# Patient Record
Sex: Male | Born: 1960 | Race: Black or African American | Hispanic: No | Marital: Married | Smoking: Never smoker
Health system: Southern US, Community
[De-identification: ages and names within clinical notes are randomized; demographics above are authoritative.]

## PROBLEM LIST (undated history)

## (undated) DIAGNOSIS — E785 Hyperlipidemia, unspecified: Secondary | ICD-10-CM

## (undated) DIAGNOSIS — J302 Other seasonal allergic rhinitis: Secondary | ICD-10-CM

## (undated) DIAGNOSIS — Z723 Lack of physical exercise: Secondary | ICD-10-CM

## (undated) DIAGNOSIS — E663 Overweight: Secondary | ICD-10-CM

## (undated) DIAGNOSIS — H5461 Unqualified visual loss, right eye, normal vision left eye: Secondary | ICD-10-CM

## (undated) DIAGNOSIS — Z800 Family history of malignant neoplasm of digestive organs: Secondary | ICD-10-CM

## (undated) DIAGNOSIS — K635 Polyp of colon: Secondary | ICD-10-CM

## (undated) DIAGNOSIS — E119 Type 2 diabetes mellitus without complications: Secondary | ICD-10-CM

## (undated) DIAGNOSIS — C61 Malignant neoplasm of prostate: Principal | ICD-10-CM

## (undated) DIAGNOSIS — N5089 Other specified disorders of the male genital organs: Secondary | ICD-10-CM

## (undated) DIAGNOSIS — G4730 Sleep apnea, unspecified: Secondary | ICD-10-CM

## (undated) DIAGNOSIS — K439 Ventral hernia without obstruction or gangrene: Secondary | ICD-10-CM

## (undated) HISTORY — DX: Unqualified visual loss, right eye, normal vision left eye: H54.61

## (undated) HISTORY — DX: Other specified disorders of the male genital organs: N50.89

## (undated) HISTORY — PX: EYE SURGERY: SHX253

## (undated) HISTORY — DX: Other seasonal allergic rhinitis: J30.2

## (undated) HISTORY — DX: Ventral hernia without obstruction or gangrene: K43.9

## (undated) HISTORY — DX: Overweight: E66.3

## (undated) HISTORY — DX: Family history of malignant neoplasm of digestive organs: Z80.0

## (undated) HISTORY — DX: Lack of physical exercise: Z72.3

## (undated) HISTORY — DX: Sleep apnea, unspecified: G47.30

## (undated) HISTORY — PX: TESTICULAR EXPLORATION: SHX5145

## (undated) HISTORY — DX: Polyp of colon: K63.5

## (undated) HISTORY — PX: COLONOSCOPY: SHX174

---

## 2005-05-10 ENCOUNTER — Emergency Department: Payer: Self-pay | Admitting: Unknown Physician Specialty

## 2005-05-10 ENCOUNTER — Other Ambulatory Visit: Payer: Self-pay

## 2005-12-09 ENCOUNTER — Ambulatory Visit: Payer: Self-pay | Admitting: Gastroenterology

## 2005-12-09 HISTORY — PX: COLONOSCOPY W/ POLYPECTOMY: SHX1380

## 2006-11-29 ENCOUNTER — Ambulatory Visit: Payer: Self-pay | Admitting: General Practice

## 2007-01-03 ENCOUNTER — Ambulatory Visit: Payer: Self-pay | Admitting: General Practice

## 2007-04-25 IMAGING — CT CT CHEST W/ CM
2 series · 15 of 31 positions shown, 19 images · IV contrast (APPLIED)
Comparison: none

REASON FOR EXAM: Chest pain. Rm 15
COMMENTS:

[Series 5: soft tissue · axial · 0.74mm/px · z∈[+602,+644]mm · 2 of 91 slices shown]
[im 7/91  mediastinal]
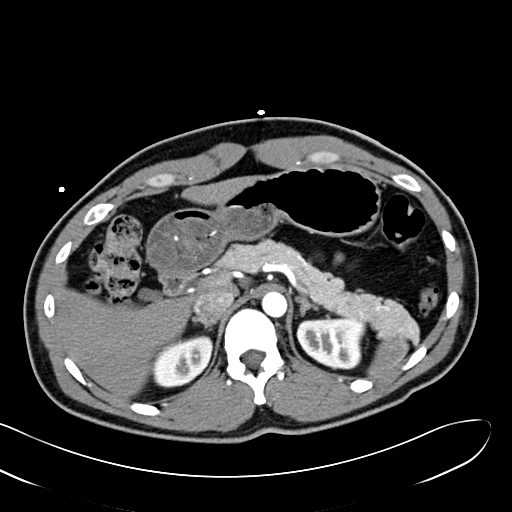
[im 21/91  mediastinal]
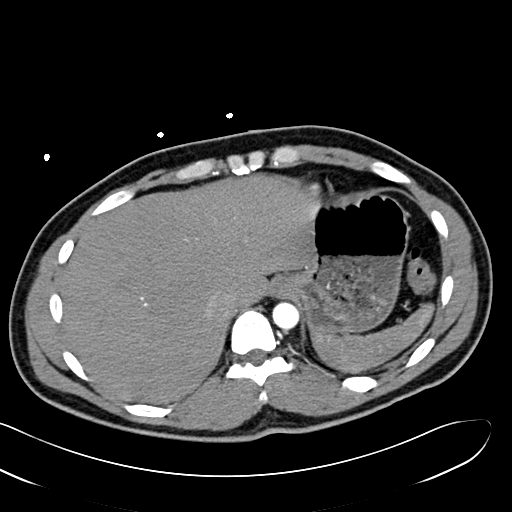

[Series 6: lung windows · axial · 0.74mm/px · z∈[+608,+834]mm · 13 of 89 slices shown, 17 images]
[im 7/89  mediastinal]
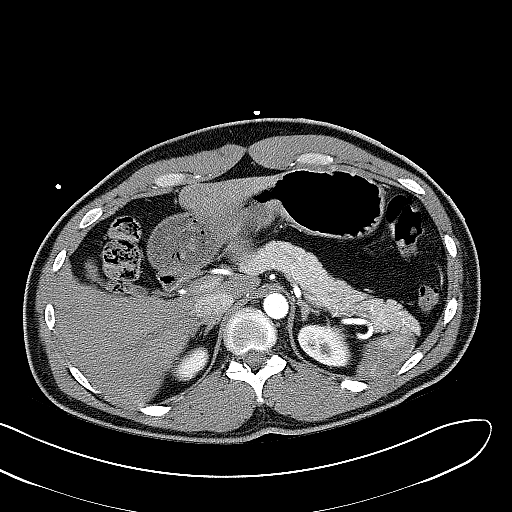
[im 7/89  lung]
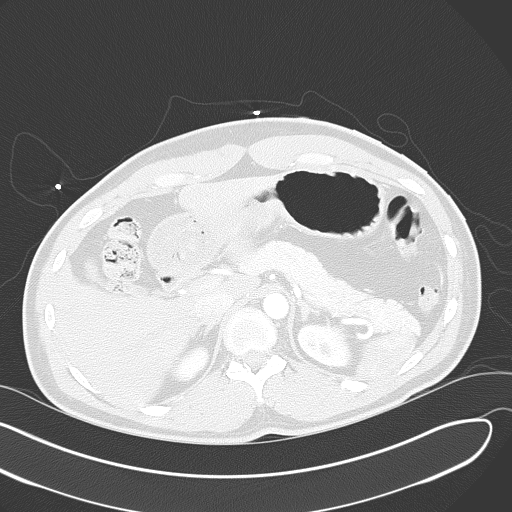
[im 14/89  lung]
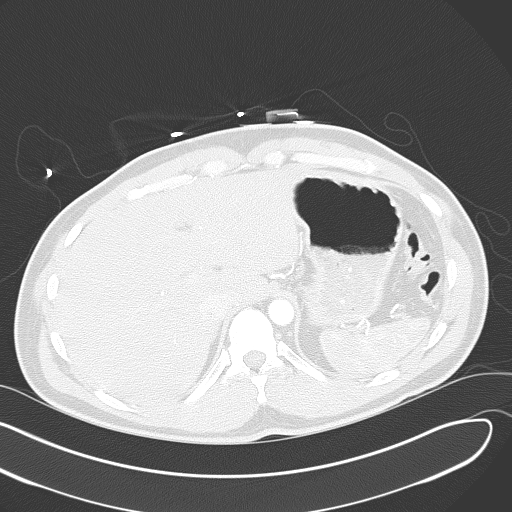
[im 21/89  lung]
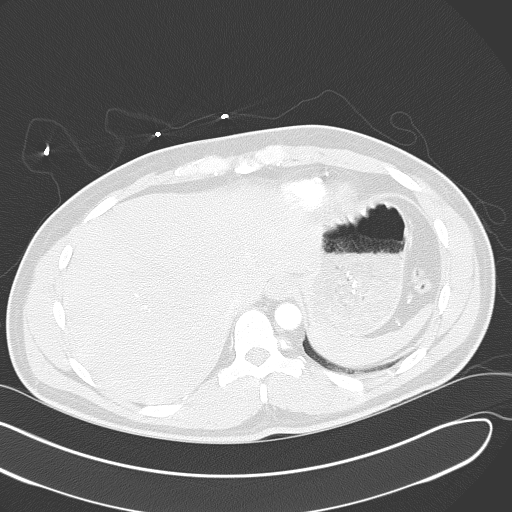
[im 28/89  lung]
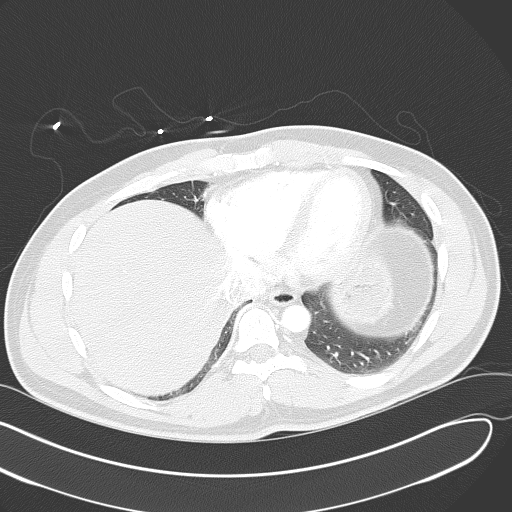
[im 34/89  mediastinal]
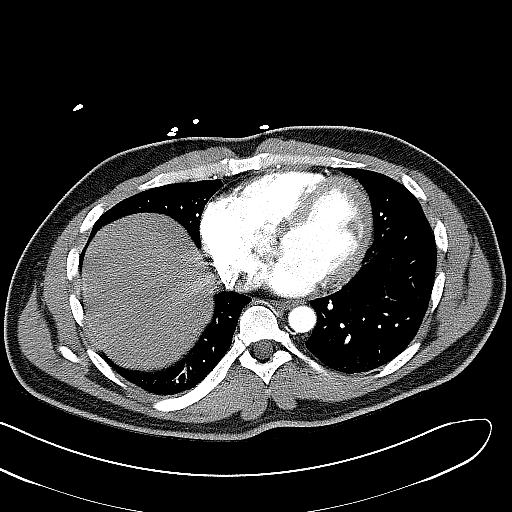
[im 34/89  lung]
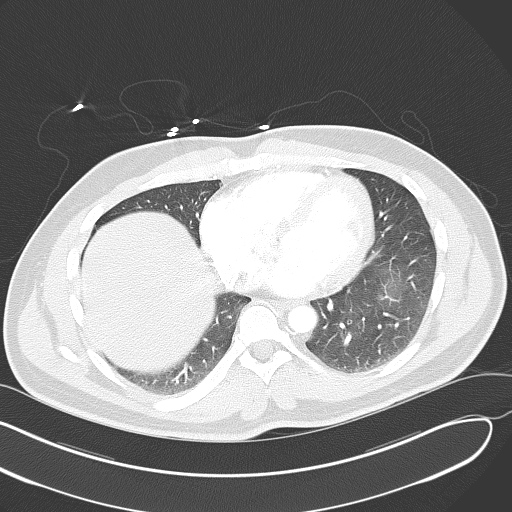
[im 41/89  lung]
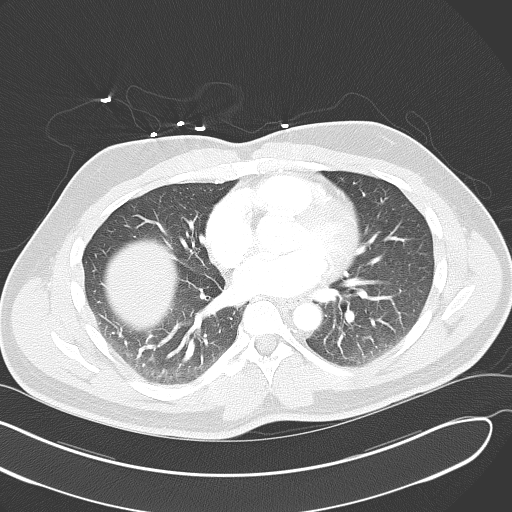
[im 45/89  lung]
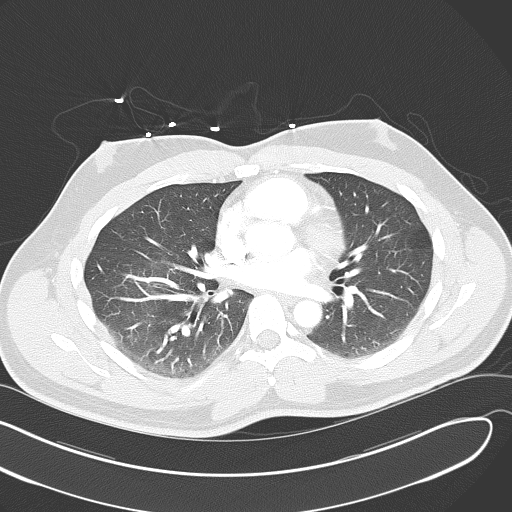
[im 48/89  lung]
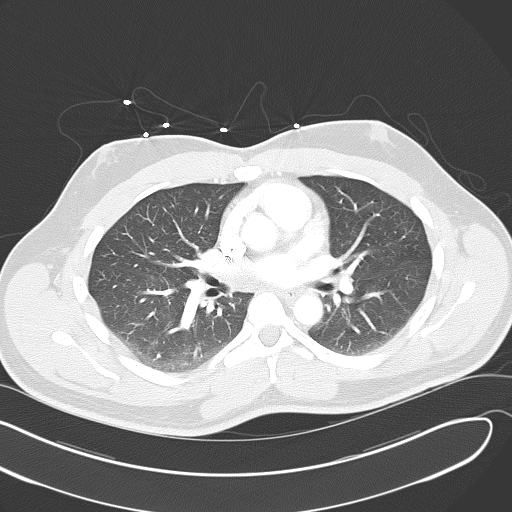
[im 55/89  mediastinal]
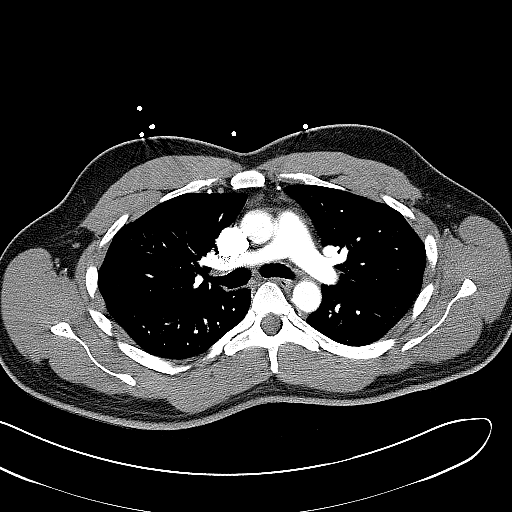
[im 55/89  lung]
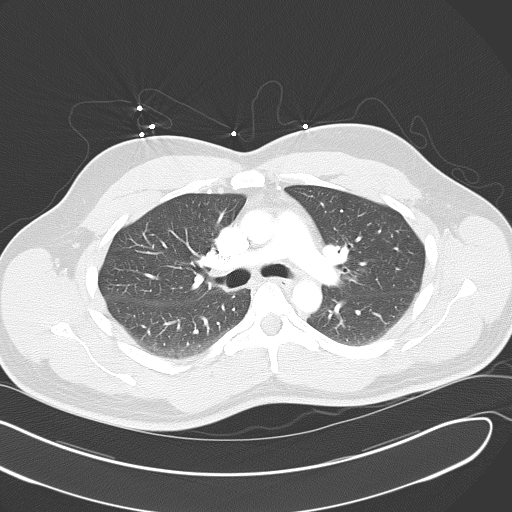
[im 61/89  lung]
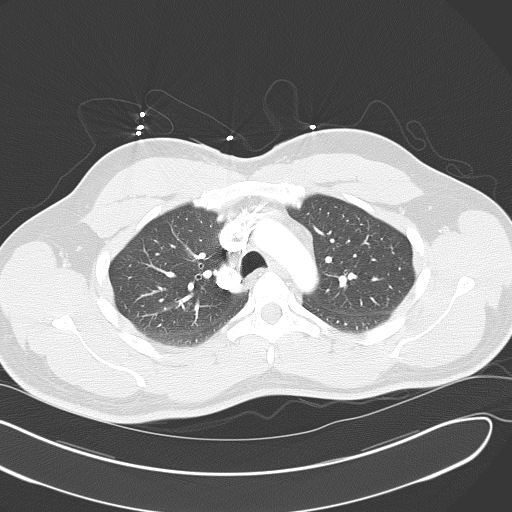
[im 68/89  lung]
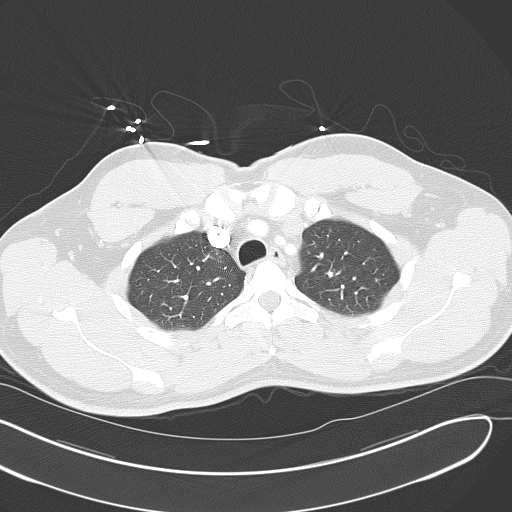
[im 75/89  lung]
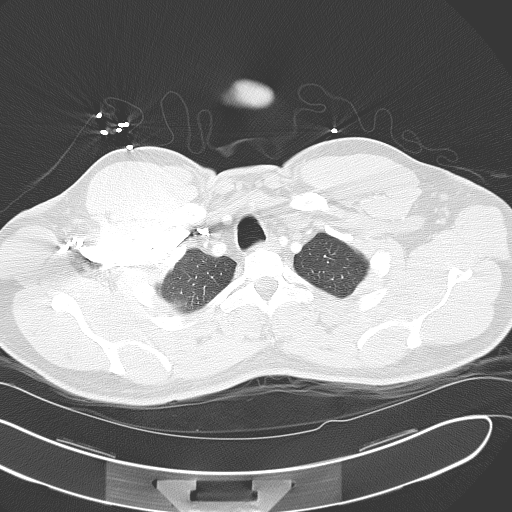
[im 82/89  mediastinal]
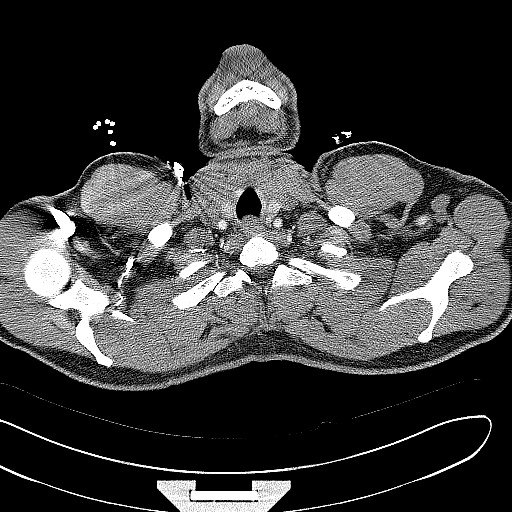
[im 82/89  lung]
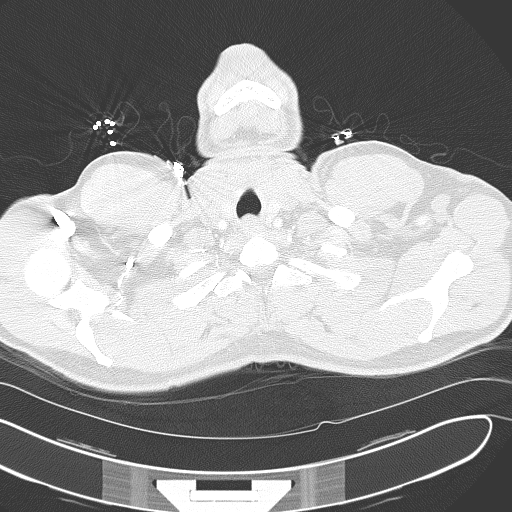

[15 of 31 positions shown; findings below may reference images not displayed]

PROCEDURE:     CT  - CT CHEST (FOR PE) W  - May 10, 2005  [DATE]

RESULT:     The study was tailored to evaluate the patient for possible
acute pulmonary embolism.

The contrast within the pulmonary arterial tree is normal in appearance.  I
do not see evidence of an acute pulmonary embolism.  The cardiac chambers
are not enlarged.  There is no evidence of a pleural or pericardial
effusion.  At lung window settings I see no interstitial or alveolar
infiltrate or abnormal nodules.  The caliber of the thoracic aorta is
normal.  Within the upper abdomen the observed portions of the liver exhibit
no acute abnormality.  The spleen is not enlarged.  I see no adrenal masses.
 The gallbladder appears contracted.
IMPRESSION: I see no acute pulmonary embolism or other acute abnormality of the lungs or
heart.

I see no acute abnormality elsewhere in the thorax.

A preliminary report was sent to the emergency room at the conclusion of the
study.

## 2008-05-24 ENCOUNTER — Ambulatory Visit: Payer: Self-pay | Admitting: Urology

## 2008-05-30 ENCOUNTER — Ambulatory Visit: Payer: Self-pay | Admitting: Urology

## 2010-11-28 ENCOUNTER — Ambulatory Visit: Payer: Self-pay | Admitting: Gastroenterology

## 2011-06-13 ENCOUNTER — Ambulatory Visit: Payer: Self-pay | Admitting: General Practice

## 2015-02-15 ENCOUNTER — Ambulatory Visit: Payer: Self-pay | Admitting: Urology

## 2015-02-17 ENCOUNTER — Ambulatory Visit: Payer: Self-pay | Admitting: Obstetrics and Gynecology

## 2015-02-17 ENCOUNTER — Encounter: Payer: Self-pay | Admitting: Obstetrics and Gynecology

## 2015-02-17 ENCOUNTER — Ambulatory Visit: Payer: Self-pay | Admitting: Urology

## 2018-07-14 ENCOUNTER — Other Ambulatory Visit: Payer: Self-pay

## 2018-07-15 LAB — CMP12+LP+TP+TSH+6AC+PSA+CBC…
ALT: 38 IU/L (ref 0–44)
Albumin/Globulin Ratio: 1.7 (ref 1.2–2.2)
Alkaline Phosphatase: 89 IU/L (ref 39–117)
BUN/Creatinine Ratio: 16 (ref 9–20)
Basophils Absolute: 0 10*3/uL (ref 0.0–0.2)
Basos: 0 %
Calcium: 10.1 mg/dL (ref 8.7–10.2)
Chol/HDL Ratio: 4.6 ratio (ref 0.0–5.0)
EOS (ABSOLUTE): 0.1 10*3/uL (ref 0.0–0.4)
Eos: 1 %
GFR calc non Af Amer: 83 mL/min/{1.73_m2} (ref >59)
GGT: 41 IU/L (ref 0–65)
Globulin, Total: 2.6 g/dL (ref 1.5–4.5)
Glucose: 279 mg/dL — ABNORMAL HIGH (ref 65–99)
HDL: 50 mg/dL (ref >39)
Hematocrit: 41.1 % (ref 37.5–51.0)
Hemoglobin: 14 g/dL (ref 13.0–17.7)
Immature Grans (Abs): 0 10*3/uL (ref 0.0–0.1)
Immature Granulocytes: 0 %
LDH: 117 IU/L — ABNORMAL LOW (ref 121–224)
LDL Calculated: 145 mg/dL — ABNORMAL HIGH (ref 0–99)
Lymphocytes Absolute: 2.9 10*3/uL (ref 0.7–3.1)
Lymphs: 41 %
Monocytes Absolute: 0.6 10*3/uL (ref 0.1–0.9)
Neutrophils: 49 %
Prostate Specific Ag, Serum: 1.9 ng/mL (ref 0.0–4.0)
RBC: 4.62 x10E6/uL (ref 4.14–5.80)
RDW: 11.9 % (ref 11.6–15.4)
Sodium: 135 mmol/L (ref 134–144)
Total Protein: 7.1 g/dL (ref 6.0–8.5)
Triglycerides: 180 mg/dL — ABNORMAL HIGH (ref 0–149)
Uric Acid: 5.3 mg/dL (ref 3.7–8.6)
WBC: 7 10*3/uL (ref 3.4–10.8)

## 2018-07-15 LAB — MICROALBUMIN / CREATININE URINE RATIO
Creatinine, Urine: 127.5 mg/dL
Microalb/Creat Ratio: 7 mg/g creat (ref 0–29)
Microalbumin, Urine: 9.1 ug/mL

## 2018-07-15 LAB — CMP12+LP+TP+TSH+6AC+PSA+CBC?
AST: 25 IU/L (ref 0–40)
Albumin: 4.5 g/dL (ref 3.8–4.9)
BUN: 16 mg/dL (ref 6–24)
Bilirubin Total: 0.8 mg/dL (ref 0.0–1.2)
Chloride: 99 mmol/L (ref 96–106)
Cholesterol, Total: 231 mg/dL — ABNORMAL HIGH (ref 100–199)
Creatinine, Ser: 1.01 mg/dL (ref 0.76–1.27)
Estimated CHD Risk: 0.9 times avg. (ref 0.0–1.0)
Free Thyroxine Index: 1.8 (ref 1.2–4.9)
GFR calc Af Amer: 96 mL/min/{1.73_m2} (ref >59)
Iron: 128 ug/dL (ref 38–169)
MCH: 30.3 pg (ref 26.6–33.0)
MCHC: 34.1 g/dL (ref 31.5–35.7)
MCV: 89 fL (ref 79–97)
Monocytes: 9 %
Neutrophils Absolute: 3.4 10*3/uL (ref 1.4–7.0)
Phosphorus: 3.6 mg/dL (ref 2.8–4.1)
Platelets: 254 10*3/uL (ref 150–450)
Potassium: 4.3 mmol/L (ref 3.5–5.2)
T3 Uptake Ratio: 26 % (ref 24–39)
T4, Total: 7 ug/dL (ref 4.5–12.0)
TSH: 1.73 u[IU]/mL (ref 0.450–4.500)
VLDL Cholesterol Cal: 36 mg/dL (ref 5–40)

## 2018-07-15 LAB — HGB A1C W/O EAG: Hgb A1c MFr Bld: 10.1 % — ABNORMAL HIGH (ref 4.8–5.6)

## 2018-09-18 ENCOUNTER — Other Ambulatory Visit: Admission: RE | Admit: 2018-09-18 | Payer: Self-pay | Source: Ambulatory Visit

## 2018-09-18 ENCOUNTER — Other Ambulatory Visit
Admission: RE | Admit: 2018-09-18 | Discharge: 2018-09-18 | Disposition: A | Discharge status: Home / Self Care | Payer: 59 | Source: Ambulatory Visit | Attending: Internal Medicine | Admitting: Internal Medicine

## 2018-09-18 ENCOUNTER — Other Ambulatory Visit: Payer: Self-pay

## 2018-09-18 DIAGNOSIS — Z20828 Contact with and (suspected) exposure to other viral communicable diseases: Secondary | ICD-10-CM | POA: Insufficient documentation

## 2018-09-18 DIAGNOSIS — Z01812 Encounter for preprocedural laboratory examination: Secondary | ICD-10-CM | POA: Insufficient documentation

## 2018-09-19 LAB — SARS CORONAVIRUS 2 (TAT 6-24 HRS): SARS Coronavirus 2: NEGATIVE

## 2018-09-23 ENCOUNTER — Encounter
Admission: RE | Disposition: A | Discharge status: Home / Self Care | Payer: Self-pay | Source: Home / Self Care | Attending: Internal Medicine

## 2018-09-23 ENCOUNTER — Ambulatory Visit: Payer: 59 | Admitting: Anesthesiology

## 2018-09-23 ENCOUNTER — Other Ambulatory Visit: Payer: Self-pay

## 2018-09-23 ENCOUNTER — Encounter: Payer: Self-pay | Admitting: Emergency Medicine

## 2018-09-23 ENCOUNTER — Ambulatory Visit
Admission: RE | Admit: 2018-09-23 | Discharge: 2018-09-23 | Disposition: A | Discharge status: Home / Self Care | Payer: 59 | Attending: Internal Medicine | Admitting: Internal Medicine

## 2018-09-23 DIAGNOSIS — Z7984 Long term (current) use of oral hypoglycemic drugs: Secondary | ICD-10-CM | POA: Insufficient documentation

## 2018-09-23 DIAGNOSIS — Z7982 Long term (current) use of aspirin: Secondary | ICD-10-CM | POA: Diagnosis not present

## 2018-09-23 DIAGNOSIS — E119 Type 2 diabetes mellitus without complications: Secondary | ICD-10-CM | POA: Diagnosis not present

## 2018-09-23 DIAGNOSIS — K640 First degree hemorrhoids: Secondary | ICD-10-CM | POA: Diagnosis not present

## 2018-09-23 DIAGNOSIS — K5730 Diverticulosis of large intestine without perforation or abscess without bleeding: Secondary | ICD-10-CM | POA: Insufficient documentation

## 2018-09-23 DIAGNOSIS — Z800 Family history of malignant neoplasm of digestive organs: Secondary | ICD-10-CM | POA: Diagnosis not present

## 2018-09-23 DIAGNOSIS — Z86010 Personal history of colonic polyps: Secondary | ICD-10-CM | POA: Insufficient documentation

## 2018-09-23 DIAGNOSIS — Z79899 Other long term (current) drug therapy: Secondary | ICD-10-CM | POA: Insufficient documentation

## 2018-09-23 DIAGNOSIS — Z1211 Encounter for screening for malignant neoplasm of colon: Secondary | ICD-10-CM | POA: Diagnosis not present

## 2018-09-23 DIAGNOSIS — E785 Hyperlipidemia, unspecified: Secondary | ICD-10-CM | POA: Diagnosis not present

## 2018-09-23 HISTORY — DX: Hyperlipidemia, unspecified: E78.5

## 2018-09-23 HISTORY — DX: Type 2 diabetes mellitus without complications: E11.9

## 2018-09-23 HISTORY — PX: COLONOSCOPY WITH PROPOFOL: SHX5780

## 2018-09-23 LAB — GLUCOSE, CAPILLARY: Glucose-Capillary: 109 mg/dL — ABNORMAL HIGH (ref 70–99)

## 2018-09-23 SURGERY — COLONOSCOPY WITH PROPOFOL
Anesthesia: General

## 2018-09-23 MED ORDER — PROPOFOL 500 MG/50ML IV EMUL
INTRAVENOUS | Status: AC
  Filled 2018-09-23: qty 400

## 2018-09-23 MED ORDER — PROPOFOL 500 MG/50ML IV EMUL
INTRAVENOUS | Status: AC
  Filled 2018-09-23: qty 100

## 2018-09-23 MED ORDER — PROPOFOL 10 MG/ML IV BOLUS
INTRAVENOUS | Status: DC | PRN
  Administered 2018-09-23 (×6): 50 mg via INTRAVENOUS

## 2018-09-23 MED ORDER — PHENYLEPHRINE HCL (PRESSORS) 10 MG/ML IV SOLN
INTRAVENOUS | Status: AC
  Filled 2018-09-23: qty 1

## 2018-09-23 MED ORDER — EPHEDRINE SULFATE 50 MG/ML IJ SOLN
INTRAMUSCULAR | Status: AC
  Filled 2018-09-23: qty 1

## 2018-09-23 MED ORDER — GLYCOPYRROLATE 0.2 MG/ML IJ SOLN
INTRAMUSCULAR | Status: AC
  Filled 2018-09-23: qty 5

## 2018-09-23 MED ORDER — GLYCOPYRROLATE 0.2 MG/ML IJ SOLN
INTRAMUSCULAR | Status: AC
  Filled 2018-09-23: qty 2

## 2018-09-23 MED ORDER — LIDOCAINE HCL (PF) 2 % IJ SOLN
INTRAMUSCULAR | Status: AC
  Filled 2018-09-23: qty 20

## 2018-09-23 MED ORDER — LIDOCAINE HCL (PF) 2 % IJ SOLN
INTRAMUSCULAR | Status: AC
  Filled 2018-09-23: qty 50

## 2018-09-23 MED ORDER — SODIUM CHLORIDE 0.9 % IV SOLN
INTRAVENOUS | Status: DC
  Administered 2018-09-23: 08:00:00 1000 mL via INTRAVENOUS

## 2018-09-23 MED ORDER — GLYCOPYRROLATE 0.2 MG/ML IJ SOLN
INTRAMUSCULAR | Status: AC
  Filled 2018-09-23: qty 1

## 2018-09-23 MED ORDER — PHENYLEPHRINE HCL (PRESSORS) 10 MG/ML IV SOLN
INTRAVENOUS | Status: DC | PRN
  Administered 2018-09-23: 100 ug via INTRAVENOUS

## 2018-09-23 MED ORDER — PROPOFOL 10 MG/ML IV BOLUS
INTRAVENOUS | Status: AC
  Filled 2018-09-23: qty 60

## 2018-09-23 NOTE — Transfer of Care (Signed)
Immediate Anesthesia Transfer of Care Note  Patient: HAZIEL MOLNER  Procedure(s) Performed: COLONOSCOPY WITH PROPOFOL (N/A )  Patient Location: Endoscopy Unit  Anesthesia Type:General  Level of Consciousness: drowsy and responds to stimulation  Airway & Oxygen Therapy: Patient Spontanous Breathing and Patient connected to face mask oxygen  Post-op Assessment: Report given to RN and Post -op Vital signs reviewed and stable  Post vital signs: Reviewed and stable  Last Vitals:  Vitals Value Taken Time  BP    Temp    Pulse 85 09/23/18 0835  Resp 10 09/23/18 0835  SpO2 100 % 09/23/18 0835  Vitals shown include unvalidated device data.  Last Pain:  Vitals:   09/23/18 0731  TempSrc: Tympanic  PainSc: 0-No pain         Complications: No apparent anesthesia complications

## 2018-09-23 NOTE — H&P (Signed)
Outpatient short stay form Pre-procedure 09/23/2018 8:18 AM Teodoro K. Alice Reichert, M.D.  Primary Physician: Lebron Conners, M.D.  Reason for visit: Personal hx of colon polyps and FH colon cancer  History of present illness:  As above. Patient denies change in bowel habits, rectal bleeding, weight loss or abdominal pain.     Current Facility-Administered Medications:  .  0.9 %  sodium chloride infusion, , Intravenous, Continuous, Route 7 [place], Benay Pike, MD, Last Rate: 20 mL/hr at 09/23/18 0746, 1,000 mL at 09/23/18 0746  Medications Prior to Admission  Medication Sig Dispense Refill Last Dose  . aspirin 81 MG chewable tablet Chew 81 mg by mouth daily.   Past Week at Unknown time  . atorvastatin (LIPITOR) 10 MG tablet Take 10 mg by mouth daily.   Past Week at Unknown time  . metFORMIN (GLUCOPHAGE) 1000 MG tablet Take 1,000 mg by mouth 2 (two) times daily with a meal.   Past Week at Unknown time     Not on File   Past Medical History:  Diagnosis Date  . Diabetes mellitus without complication ([place])    type 2  . Hyperlipidemia     Review of systems:  Otherwise negative.    Physical Exam  Gen: Alert, oriented. Appears stated age.  HEENT: [place]/AT. PERRLA. Lungs: CTA, no wheezes. CV: RR nl S1, S2. Abd: soft, benign, no masses. BS+ Ext: No edema. Pulses 2+    Planned procedures: Proceed with colonoscopy. The patient understands the nature of the planned procedure, indications, risks, alternatives and potential complications including but not limited to bleeding, infection, perforation, damage to internal organs and possible oversedation/side effects from anesthesia. The patient agrees and gives consent to proceed.  Please refer to procedure notes for findings, recommendations and patient disposition/instructions.     Teodoro K. Alice Reichert, M.D. Gastroenterology 09/23/2018  8:18 AM

## 2018-09-23 NOTE — Op Note (Signed)
[facility] Gastroenterology Patient Name: Trevione Wert Procedure Date: 09/23/2018 7:26 AM MRN: [id number] Account #: [id number] Date of Birth: 02-22-1960 Admit Type: Outpatient Age: 58 Room: [facility] ENDO ROOM 3 Gender: Male Note Status: Finalized Procedure:            Colonoscopy Indications:          High risk colon cancer surveillance: Personal history                        of colonic polyps Providers:            Benay Pike. Kanai Berrios MD, MD Medicines:            Propofol per Anesthesia Complications:        No immediate complications. Procedure:            Pre-Anesthesia Assessment:                       - The risks and benefits of the procedure and the                        sedation options and risks were discussed with the                        patient. All questions were answered and informed                        consent was obtained.                       - Patient identification and proposed procedure were                        verified prior to the procedure by the nurse. The                        procedure was verified in the procedure room.                       - ASA Grade Assessment: III - A patient with severe                        systemic disease.                       - After reviewing the risks and benefits, the patient                        was deemed in satisfactory condition to undergo the                        procedure.                       After obtaining informed consent, the colonoscope was                        passed under direct vision. Throughout the procedure,                        the patient's blood pressure, pulse, and oxygen  saturations were monitored continuously. The                        Colonoscope was introduced through the anus and                        advanced to the the cecum, identified by appendiceal                        orifice and ileocecal valve. The colonoscopy was                performed without difficulty. The patient tolerated the                        procedure well. The quality of the bowel preparation                        was good. The ileocecal valve, appendiceal orifice, and                        rectum were photographed. Findings:      The perianal and digital rectal examinations were normal. Pertinent       negatives include normal sphincter tone and no palpable rectal lesions.      A few small-mouthed diverticula were found in the sigmoid colon.      Non-bleeding internal hemorrhoids were found during retroflexion. The       hemorrhoids were Grade I (internal hemorrhoids that do not prolapse). Impression:           - Diverticulosis in the sigmoid colon.                       - Non-bleeding internal hemorrhoids.                       - No specimens collected. Recommendation:       - Repeat colonoscopy in 5 years for surveillance.                       - Return to GI office PRN. Procedure Code(s):    --- Professional ---                       [id number], Colorectal cancer screening; colonoscopy on                        individual at high risk Diagnosis Code(s):    --- Professional ---                       K57.30, Diverticulosis of large intestine without                        perforation or abscess without bleeding                       K64.0, First degree hemorrhoids                       Z86.010, Personal history of colonic polyps CPT copyright 2019 American Medical Association. All rights reserved. The codes documented in this report are preliminary and upon coder review may  be revised to  meet current compliance requirements. Stanton Kidneyeodoro K Deandrew Hoecker MD, MD 09/23/2018 8:36:59 AM This report has been signed electronically. Number of Addenda: 0 Note Initiated On: 09/23/2018 7:26 AM Scope Withdrawal Time: 0 hours 5 minutes 32 seconds  Total Procedure Duration: 0 hours 9 minutes 10 seconds  Estimated Blood Loss: Estimated blood loss: none.       [facility]

## 2018-09-23 NOTE — Anesthesia Postprocedure Evaluation (Signed)
Anesthesia Post Note  Patient: Jack Marshall  Procedure(s) Performed: COLONOSCOPY WITH PROPOFOL (N/A )  Patient location during evaluation: Endoscopy Anesthesia Type: General Level of consciousness: awake and alert and oriented Pain management: pain level controlled Vital Signs Assessment: post-procedure vital signs reviewed and stable Respiratory status: spontaneous breathing, nonlabored ventilation and respiratory function stable Cardiovascular status: blood pressure returned to baseline and stable Postop Assessment: no signs of nausea or vomiting Anesthetic complications: no     Last Vitals:  Vitals:   09/23/18 0858 09/23/18 0908  BP: 119/87 117/81  Pulse: 80   Resp: 20 13  Temp:    SpO2: 99% 99%    Last Pain:  Vitals:   09/23/18 0908  TempSrc:   PainSc: 0-No pain                 Yakov Bergen

## 2018-09-23 NOTE — Anesthesia Preprocedure Evaluation (Signed)
Anesthesia Evaluation  Patient identified by MRN, date of birth, ID band Patient awake    Reviewed: Allergy & Precautions, NPO status , Patient's Chart, lab work & pertinent test results  History of Anesthesia Complications Negative for: history of anesthetic complications  Airway Mallampati: II  TM Distance: >3 FB Neck ROM: Full    Dental no notable dental hx.    Pulmonary neg pulmonary ROS, neg sleep apnea, neg COPD,    breath sounds clear to auscultation- rhonchi (-) wheezing      Cardiovascular Exercise Tolerance: Good (-) hypertension(-) CAD, (-) Past MI, (-) Cardiac Stents and (-) CABG  Rhythm:Regular Rate:Normal - Systolic murmurs and - Diastolic murmurs    Neuro/Psych neg Seizures negative neurological ROS  negative psych ROS   GI/Hepatic negative GI ROS, Neg liver ROS,   Endo/Other  diabetes, Oral Hypoglycemic Agents  Renal/GU negative Renal ROS     Musculoskeletal negative musculoskeletal ROS (+)   Abdominal (+) - obese,   Peds  Hematology negative hematology ROS (+)   Anesthesia Other Findings Past Medical History: No date: Diabetes mellitus without complication (HCC)     Comment:  type 2 No date: Hyperlipidemia   Reproductive/Obstetrics                             Anesthesia Physical Anesthesia Plan  ASA: II  Anesthesia Plan: General   Post-op Pain Management:    Induction: Intravenous  PONV Risk Score and Plan: 1 and Propofol infusion  Airway Management Planned: Natural Airway  Additional Equipment:   Intra-op Plan:   Post-operative Plan:   Informed Consent: I have reviewed the patients History and Physical, chart, labs and discussed the procedure including the risks, benefits and alternatives for the proposed anesthesia with the patient or authorized representative who has indicated his/her understanding and acceptance.     Dental advisory given  Plan  Discussed with: CRNA and Anesthesiologist  Anesthesia Plan Comments:         Anesthesia Quick Evaluation

## 2018-09-23 NOTE — Interval H&P Note (Signed)
History and Physical Interval Note:  09/23/2018 8:18 AM  Bobbe Medico  has presented today for surgery, with the diagnosis of PERSONAL HX.OF COLON POLYPS,FAMILY HX.OF COLON CANCER.  The various methods of treatment have been discussed with the patient and family. After consideration of risks, benefits and other options for treatment, the patient has consented to  Procedure(s): COLONOSCOPY WITH PROPOFOL (N/A) as a surgical intervention.  The patient's history has been reviewed, patient examined, no change in status, stable for surgery.  I have reviewed the patient's chart and labs.  Questions were answered to the patient's satisfaction.     [place], Hermann

## 2018-09-23 NOTE — Anesthesia Post-op Follow-up Note (Signed)
Anesthesia QCDR form completed.        

## 2018-09-24 ENCOUNTER — Encounter: Payer: Self-pay | Admitting: Internal Medicine

## 2018-10-01 ENCOUNTER — Telehealth: Payer: Self-pay | Admitting: Internal Medicine

## 2018-10-01 NOTE — Telephone Encounter (Signed)
Do not see any communication in Epic not sure who pt is calling back.

## 2018-10-01 NOTE — Telephone Encounter (Signed)
Returning call.

## 2018-10-20 ENCOUNTER — Ambulatory Visit: Payer: 59 | Admitting: Internal Medicine

## 2018-10-20 ENCOUNTER — Encounter: Payer: Self-pay | Admitting: Internal Medicine

## 2018-10-20 ENCOUNTER — Other Ambulatory Visit: Payer: Self-pay

## 2018-10-20 VITALS — BP 128/91 | HR 91 | Temp 96.4°F | Resp 14 | Ht 71.0 in | Wt 166.0 lb

## 2018-10-20 DIAGNOSIS — R030 Elevated blood-pressure reading, without diagnosis of hypertension: Secondary | ICD-10-CM | POA: Insufficient documentation

## 2018-10-20 DIAGNOSIS — E119 Type 2 diabetes mellitus without complications: Secondary | ICD-10-CM

## 2018-10-20 DIAGNOSIS — E7849 Other hyperlipidemia: Secondary | ICD-10-CM

## 2018-10-20 DIAGNOSIS — Z800 Family history of malignant neoplasm of digestive organs: Secondary | ICD-10-CM

## 2018-10-20 DIAGNOSIS — N529 Male erectile dysfunction, unspecified: Secondary | ICD-10-CM | POA: Insufficient documentation

## 2018-10-20 NOTE — Progress Notes (Signed)
S - Patient with a h/o NIDDM who follows-up for the every 3 month check-up. He did not have labs drawn prior to today's visit to help f/u.   Has been feeling well with no complaints, the urinary frequency is a little better since last visit.  Taking the medicines for his DM regularly  Not check blood sugars at home No CP, SOB, marked fatigue, numbness or tingling in the extremities, no LE swelling  Had colonoscopy 09/23/2018 and told all was good, no polyps of concern.  Did not follow-up with nutrition since last visit  Has not seen an eye MD either since last visit  Trying to eat a healthy diet and manage weight, avoiding goodies Exercise - still tries to remain active, noted marches in place a times last visit   No tob use Alcohol use - noted very rare, twice a year maybe  Current Outpatient Medications on File Prior to Visit  Medication Sig Dispense Refill  . aspirin 81 MG chewable tablet Chew 81 mg by mouth daily.    Marland Kitchen atorvastatin (LIPITOR) 10 MG tablet Take 10 mg by mouth daily.    . metFORMIN (GLUCOPHAGE) 1000 MG tablet Take 1,000 mg by mouth 2 (two) times daily with a meal.    . Multiple Vitamin (MULTIVITAMIN) capsule Take 1 capsule by mouth daily.     No current facility-administered medications on file prior to visit.      No Known Allergies   O - NAD  BP (!) 128/91 (BP Location: Right Arm, Patient Position: Sitting, Cuff Size: Large)   Pulse 91   Temp (!) 96.4 F (35.8 C) (Oral)   Resp 14   Ht 5\' 11"  (1.803 m)   Wt 166 lb (75.3 kg)   SpO2 98%   BMI 23.15 kg/m     BP last visit - 128/82 in June Weight 176 last visit (down 10 lbs)  HEENT - sclera anicteric Neck - carotids 2+ and = with no bruits,  Car - RRR without m/g/r Pulm - CTA Abd - NT Ext - no LE edema Neuro - Affect not flat, approp with conversation  Grossly nonfocal  Last labs reviewed including A1C - 10.1 in June (was 6.5 and 7.6 on prior checks in March 2020 and Dec 2019), LDL - 145  No  labs to review today  A/P - 1. NIDDM - not well controlled in recent past   Continue current medication (metformin) Importance of diet modifications emphasized Importance of some regular aerobic exercise encouraged Above to help with weight control/weight loss also important and has been doing great with this Check A1C today and also BMP, and rec'ed gets signed for MyChart on way out today to let him know results Again rec'ed f/u with eye MD for eval    2.  Hyperlipidemia   On a statin to continue Importance of diet modifications and some regular aerobic exercise encouraged Above to help with weight control/weight loss also very important  Will recheck FLP in another 3 months (offered to check today and he noted ok to wait as originally planned)  3. FH colon CA - did get scope done and all ok  4. Increased BP without dx of HTN - slightly higher reading today than prior readings in recent past  Cont to monitor at present  5. ED hx - not taking meds in recent past  F/u again in 3 months, sooner prn, and will check FLP, A1C and urine microalbumin prior to f/u visit, F/u  sooner prn

## 2018-10-20 NOTE — Addendum Note (Signed)
Addended by: Virgilio Frees on: 10/20/2018 04:06 PM   Modules accepted: Orders

## 2018-10-21 LAB — BASIC METABOLIC PANEL
BUN/Creatinine Ratio: 10 (ref 9–20)
BUN: 12 mg/dL (ref 6–24)
CO2: 25 mmol/L (ref 20–29)
Calcium: 10.3 mg/dL — ABNORMAL HIGH (ref 8.7–10.2)
Chloride: 98 mmol/L (ref 96–106)
Creatinine, Ser: 1.19 mg/dL (ref 0.76–1.27)
GFR calc Af Amer: 78 mL/min/{1.73_m2} (ref >59)
GFR calc non Af Amer: 67 mL/min/{1.73_m2} (ref >59)
Glucose: 110 mg/dL — ABNORMAL HIGH (ref 65–99)
Potassium: 4.6 mmol/L (ref 3.5–5.2)
Sodium: 140 mmol/L (ref 134–144)

## 2018-10-21 LAB — HGB A1C W/O EAG: Hgb A1c MFr Bld: 5.8 % — ABNORMAL HIGH (ref 4.8–5.6)

## 2018-11-10 ENCOUNTER — Ambulatory Visit: Payer: Self-pay

## 2018-11-10 DIAGNOSIS — Z23 Encounter for immunization: Secondary | ICD-10-CM

## 2018-12-25 ENCOUNTER — Other Ambulatory Visit: Payer: Self-pay

## 2018-12-25 DIAGNOSIS — Z20822 Contact with and (suspected) exposure to covid-19: Secondary | ICD-10-CM

## 2018-12-28 LAB — NOVEL CORONAVIRUS, NAA: SARS-CoV-2, NAA: NOT DETECTED

## 2019-01-12 ENCOUNTER — Other Ambulatory Visit: Payer: 59

## 2019-01-19 ENCOUNTER — Ambulatory Visit: Payer: 59

## 2019-06-24 ENCOUNTER — Other Ambulatory Visit: Payer: Self-pay

## 2019-06-24 ENCOUNTER — Ambulatory Visit: Payer: 59

## 2019-06-24 DIAGNOSIS — Z0000 Encounter for general adult medical examination without abnormal findings: Secondary | ICD-10-CM

## 2019-06-24 LAB — POCT URINALYSIS DIPSTICK
Bilirubin, UA: NEGATIVE
Blood, UA: NEGATIVE
Glucose, UA: NEGATIVE
Ketones, UA: NEGATIVE
Leukocytes, UA: NEGATIVE
Nitrite, UA: NEGATIVE
Protein, UA: NEGATIVE
Spec Grav, UA: >=1.03 — AB (ref 1.010–1.025)
Urobilinogen, UA: 0.2 E.U./dL
pH, UA: 6 (ref 5.0–8.0)

## 2019-06-24 NOTE — Progress Notes (Signed)
Scheduled to complete physical 06/30/19 with Ron Smith, PA-C.  AMD 

## 2019-06-25 LAB — CMP12+LP+TP+TSH+6AC+PSA+CBC…
ALT: 25 IU/L (ref 0–44)
AST: 25 IU/L (ref 0–40)
Albumin/Globulin Ratio: 1.6 (ref 1.2–2.2)
Albumin: 4.4 g/dL (ref 3.8–4.9)
Alkaline Phosphatase: 73 IU/L (ref 39–117)
BUN/Creatinine Ratio: 11 (ref 9–20)
BUN: 13 mg/dL (ref 6–24)
Basophils Absolute: 0 10*3/uL (ref 0.0–0.2)
Basos: 1 %
Bilirubin Total: 0.3 mg/dL (ref 0.0–1.2)
Calcium: 9.6 mg/dL (ref 8.7–10.2)
Chloride: 104 mmol/L (ref 96–106)
Chol/HDL Ratio: 2.7 ratio (ref 0.0–5.0)
Cholesterol, Total: 144 mg/dL (ref 100–199)
Creatinine, Ser: 1.19 mg/dL (ref 0.76–1.27)
EOS (ABSOLUTE): 0.2 10*3/uL (ref 0.0–0.4)
Eos: 2 %
Estimated CHD Risk: <0.5 times avg. (ref 0.0–1.0)
Free Thyroxine Index: 1.9 (ref 1.2–4.9)
GFR calc Af Amer: 78 mL/min/{1.73_m2} (ref >59)
GFR calc non Af Amer: 67 mL/min/{1.73_m2} (ref >59)
GGT: 29 IU/L (ref 0–65)
Globulin, Total: 2.7 g/dL (ref 1.5–4.5)
Glucose: 114 mg/dL — ABNORMAL HIGH (ref 65–99)
HDL: 53 mg/dL (ref >39)
Hematocrit: 41.5 % (ref 37.5–51.0)
Hemoglobin: 14.2 g/dL (ref 13.0–17.7)
Immature Grans (Abs): 0 10*3/uL (ref 0.0–0.1)
Immature Granulocytes: 0 %
Iron: 75 ug/dL (ref 38–169)
LDH: 154 IU/L (ref 121–224)
LDL Chol Calc (NIH): 73 mg/dL (ref 0–99)
Lymphocytes Absolute: 3.8 10*3/uL — ABNORMAL HIGH (ref 0.7–3.1)
Lymphs: 45 %
MCH: 30.4 pg (ref 26.6–33.0)
MCHC: 34.2 g/dL (ref 31.5–35.7)
MCV: 89 fL (ref 79–97)
Monocytes Absolute: 0.7 10*3/uL (ref 0.1–0.9)
Monocytes: 8 %
Neutrophils Absolute: 3.7 10*3/uL (ref 1.4–7.0)
Neutrophils: 44 %
Phosphorus: 3.9 mg/dL (ref 2.8–4.1)
Platelets: 265 10*3/uL (ref 150–450)
Potassium: 4.2 mmol/L (ref 3.5–5.2)
Prostate Specific Ag, Serum: 2.4 ng/mL (ref 0.0–4.0)
RBC: 4.67 x10E6/uL (ref 4.14–5.80)
RDW: 11.8 % (ref 11.6–15.4)
Sodium: 139 mmol/L (ref 134–144)
T3 Uptake Ratio: 27 % (ref 24–39)
T4, Total: 7 ug/dL (ref 4.5–12.0)
TSH: 2.29 u[IU]/mL (ref 0.450–4.500)
Total Protein: 7.1 g/dL (ref 6.0–8.5)
Triglycerides: 97 mg/dL (ref 0–149)
Uric Acid: 4.6 mg/dL (ref 3.8–8.4)
VLDL Cholesterol Cal: 18 mg/dL (ref 5–40)
WBC: 8.4 10*3/uL (ref 3.4–10.8)

## 2019-06-25 LAB — MICROALBUMIN / CREATININE URINE RATIO
Creatinine, Urine: 225.8 mg/dL
Microalb/Creat Ratio: 6 mg/g creat (ref 0–29)
Microalbumin, Urine: 12.5 ug/mL

## 2019-06-25 LAB — HGB A1C W/O EAG: Hgb A1c MFr Bld: 6.1 % — ABNORMAL HIGH (ref 4.8–5.6)

## 2019-06-30 ENCOUNTER — Ambulatory Visit: Payer: Self-pay | Admitting: Physician Assistant

## 2019-06-30 ENCOUNTER — Other Ambulatory Visit: Payer: Self-pay

## 2019-06-30 ENCOUNTER — Encounter: Payer: Self-pay | Admitting: Physician Assistant

## 2019-06-30 VITALS — BP 116/80 | HR 98 | Temp 98.7°F | Resp 12 | Ht 71.0 in | Wt 174.0 lb

## 2019-06-30 DIAGNOSIS — Z0000 Encounter for general adult medical examination without abnormal findings: Secondary | ICD-10-CM

## 2019-06-30 NOTE — Progress Notes (Signed)
   Subjective: Annual physical exam    Patient ID: Jack Marshall, male    DOB: 05/09/1960, 59 y.o.   MRN: [id number]  HPI Patient presents for annual physical exam voices no concerns or complaints.   Review of Systems    Diabetes and hyperlipidemia Objective:   Physical Exam No acute distress.  HEENT is grossly unremarkable.  Neck is supple without adenopathy or bruits.  Lungs are clear to auscultation.  Heart is slightly tachycardic regular in rhythm.  Abdomen negative HSM, normoactive bowel sounds, soft, and nontender to palpation.  No obvious deformity upper lower extremities.  Patient is full equal range of motion of the upper and lower extremities.  No obvious deformity to cervical lumbar spine.  Patient full neck range of motion cervical lumbar spine.  Cranial nerves II through XII grossly intact.       Assessment & Plan: Well exam  Discussed lab results with patient.  Advised to continue previous medications and follow-up as necessary.

## 2019-07-05 ENCOUNTER — Other Ambulatory Visit: Payer: Self-pay

## 2019-07-05 DIAGNOSIS — E7849 Other hyperlipidemia: Secondary | ICD-10-CM

## 2019-07-05 DIAGNOSIS — E119 Type 2 diabetes mellitus without complications: Secondary | ICD-10-CM

## 2019-07-06 MED ORDER — METFORMIN HCL 1000 MG PO TABS: 1000.0000 mg | ORAL_TABLET | Freq: Two times a day (BID) | ORAL | 3 refills | Status: DC

## 2019-07-06 MED ORDER — ATORVASTATIN CALCIUM 10 MG PO TABS: 10.0000 mg | ORAL_TABLET | Freq: Every day | ORAL | 3 refills | Status: DC

## 2019-08-10 DIAGNOSIS — H40013 Open angle with borderline findings, low risk, bilateral: Secondary | ICD-10-CM | POA: Diagnosis not present

## 2019-12-21 NOTE — Progress Notes (Deleted)
Scheduled to review lab results 12/29/19 with Kerrie Buffalo, NP.  AMD

## 2019-12-22 ENCOUNTER — Other Ambulatory Visit: Payer: 59

## 2019-12-22 DIAGNOSIS — E119 Type 2 diabetes mellitus without complications: Secondary | ICD-10-CM

## 2019-12-29 ENCOUNTER — Other Ambulatory Visit: Payer: Self-pay

## 2019-12-29 ENCOUNTER — Ambulatory Visit: Payer: 59 | Admitting: Nurse Practitioner

## 2019-12-29 DIAGNOSIS — E119 Type 2 diabetes mellitus without complications: Secondary | ICD-10-CM

## 2019-12-29 NOTE — Progress Notes (Signed)
Scheduled to review lab results 01/05/20 with Kerrie Buffalo, NP.  AMD

## 2019-12-30 LAB — BUN+CREAT
BUN/Creatinine Ratio: 10 (ref 9–20)
BUN: 11 mg/dL (ref 6–24)
Creatinine, Ser: 1.08 mg/dL (ref 0.76–1.27)
GFR calc Af Amer: 87 mL/min/{1.73_m2} (ref >59)
GFR calc non Af Amer: 75 mL/min/{1.73_m2} (ref >59)

## 2019-12-30 LAB — HGB A1C W/O EAG: Hgb A1c MFr Bld: 6 % — ABNORMAL HIGH (ref 4.8–5.6)

## 2020-01-05 ENCOUNTER — Other Ambulatory Visit: Payer: Self-pay

## 2020-01-05 ENCOUNTER — Ambulatory Visit: Payer: Self-pay | Admitting: Nurse Practitioner

## 2020-01-05 ENCOUNTER — Encounter: Payer: Self-pay | Admitting: Nurse Practitioner

## 2020-01-05 VITALS — BP 117/82 | HR 106 | Temp 97.7°F | Resp 14 | Ht 71.0 in | Wt 177.0 lb

## 2020-01-05 DIAGNOSIS — Z09 Encounter for follow-up examination after completed treatment for conditions other than malignant neoplasm: Secondary | ICD-10-CM

## 2020-01-05 NOTE — Progress Notes (Signed)
Presents for 6 months post physical DM Labs that were collected on 12/29/19.  Physical 06/30/19 with Durward Parcel, PA-C.  AMD

## 2020-01-05 NOTE — Progress Notes (Signed)
   Subjective:    Patient ID: Jack Marshall, male    DOB: 1961/01/14, 59 y.o.   MRN: [id number]  HPI Jack Marshall is a 59 y.o. male who presents to the [facility] to discuss results of lab work. He reports being on a regular exercise plan and healthy diet. He denies any problems today.  Past Medical History:  Diagnosis Date  . Colon polyp    2009, most recent in 2020 no polyps  . 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 Diabetes mellitus without complication (HCC)    type 2  . Family history of colon cancer   . Hyperlipidemia   . Lack of exercise   . Overweight   . Seasonal allergies   . Sleep apnea    does not wear cpap  . Testicular mass    right   . Ventral hernia    Past Surgical History:  Procedure Laterality Date  . COLONOSCOPY     normal colon  . COLONOSCOPY W/ POLYPECTOMY  12/09/2005   9 mm TA in ascending colon  . COLONOSCOPY WITH PROPOFOL N/A 09/23/2018   Procedure: COLONOSCOPY WITH PROPOFOL;  Surgeon: Toledo, Boykin Nearing, MD;  Location: [facility] ENDOSCOPY;  Service: Gastroenterology;  Laterality: N/A;  . EYE SURGERY    . TESTICULAR EXPLORATION     Current Outpatient Medications on File Prior to Visit  Medication Sig Dispense Refill  . aspirin 81 MG chewable tablet Chew 81 mg by mouth daily.    Marland Kitchen atorvastatin (LIPITOR) 10 MG tablet Take 1 tablet (10 mg total) by mouth daily. 90 tablet 3  . metFORMIN (GLUCOPHAGE) 1000 MG tablet Take 1 tablet (1,000 mg total) by mouth 2 (two) times daily with a meal. 180 tablet 3  . Multiple Vitamin (MULTIVITAMIN) capsule Take 1 capsule by mouth daily.     No current facility-administered medications on file prior to visit.   No Known Allergies BP 117/82 (BP Location: Left Arm, Patient Position: Sitting, Cuff Size: Large)   Pulse (!) 106   Temp 97.7 F (36.5 C) (Oral)   Resp 14   Ht 5\' 11"  (1.803 m)   Wt 177 lb (80.3 kg)   SpO2 98%   BMI 24.69 kg/m   Labs reviewed  Hgb A1c has improved and is 6.0     Assessment & Plan:  Patient without complaints today and doing well. Continue health diet and exercise. Continue medications. Return in 6 months for annual physical exam and blood work. Return sooner for any problems.

## 2020-06-27 NOTE — Progress Notes (Signed)
Pt scheduled to complete physical with Adam Scarboro, NP. CL,RMA  

## 2020-06-28 ENCOUNTER — Other Ambulatory Visit: Payer: Self-pay

## 2020-06-28 ENCOUNTER — Ambulatory Visit: Payer: Self-pay

## 2020-06-28 DIAGNOSIS — Z0000 Encounter for general adult medical examination without abnormal findings: Secondary | ICD-10-CM

## 2020-06-28 LAB — POCT URINALYSIS DIPSTICK
Bilirubin, UA: NEGATIVE
Blood, UA: NEGATIVE
Glucose, UA: NEGATIVE
Ketones, UA: NEGATIVE
Leukocytes, UA: NEGATIVE
Nitrite, UA: NEGATIVE
Protein, UA: NEGATIVE
Spec Grav, UA: 1.025 (ref 1.010–1.025)
Urobilinogen, UA: 0.2 E.U./dL
pH, UA: 5.5 (ref 5.0–8.0)

## 2020-06-29 LAB — CMP12+LP+TP+TSH+6AC+PSA+CBC…
ALT: 25 IU/L (ref 0–44)
AST: 36 IU/L (ref 0–40)
Albumin/Globulin Ratio: 2 (ref 1.2–2.2)
Albumin: 4.5 g/dL (ref 3.8–4.9)
Alkaline Phosphatase: 60 IU/L (ref 44–121)
BUN/Creatinine Ratio: 14 (ref 9–20)
BUN: 17 mg/dL (ref 6–24)
Basophils Absolute: 0 10*3/uL (ref 0.0–0.2)
Basos: 0 %
Bilirubin Total: 0.4 mg/dL (ref 0.0–1.2)
Calcium: 10.1 mg/dL (ref 8.7–10.2)
Chloride: 100 mmol/L (ref 96–106)
Chol/HDL Ratio: 2.2 ratio (ref 0.0–5.0)
Cholesterol, Total: 127 mg/dL (ref 100–199)
Creatinine, Ser: 1.2 mg/dL (ref 0.76–1.27)
EOS (ABSOLUTE): 0.2 10*3/uL (ref 0.0–0.4)
Eos: 2 %
Estimated CHD Risk: <0.5 times avg. (ref 0.0–1.0)
Free Thyroxine Index: 2.1 (ref 1.2–4.9)
GGT: 23 IU/L (ref 0–65)
Globulin, Total: 2.2 g/dL (ref 1.5–4.5)
Glucose: 100 mg/dL — ABNORMAL HIGH (ref 65–99)
HDL: 57 mg/dL (ref >39)
Hematocrit: 40 % (ref 37.5–51.0)
Hemoglobin: 13.4 g/dL (ref 13.0–17.7)
Immature Grans (Abs): 0 10*3/uL (ref 0.0–0.1)
Immature Granulocytes: 0 %
Iron: 50 ug/dL (ref 38–169)
LDH: 119 IU/L — ABNORMAL LOW (ref 121–224)
LDL Chol Calc (NIH): 57 mg/dL (ref 0–99)
Lymphocytes Absolute: 3.6 10*3/uL — ABNORMAL HIGH (ref 0.7–3.1)
Lymphs: 38 %
MCH: 31 pg (ref 26.6–33.0)
MCHC: 33.5 g/dL (ref 31.5–35.7)
MCV: 93 fL (ref 79–97)
Monocytes Absolute: 1 10*3/uL — ABNORMAL HIGH (ref 0.1–0.9)
Monocytes: 10 %
Neutrophils Absolute: 4.6 10*3/uL (ref 1.4–7.0)
Neutrophils: 50 %
Phosphorus: 4.1 mg/dL (ref 2.8–4.1)
Platelets: 262 10*3/uL (ref 150–450)
Potassium: 4.4 mmol/L (ref 3.5–5.2)
Prostate Specific Ag, Serum: 3.3 ng/mL (ref 0.0–4.0)
RBC: 4.32 x10E6/uL (ref 4.14–5.80)
RDW: 11.7 % (ref 11.6–15.4)
Sodium: 138 mmol/L (ref 134–144)
T3 Uptake Ratio: 27 % (ref 24–39)
T4, Total: 7.8 ug/dL (ref 4.5–12.0)
TSH: 2.5 u[IU]/mL (ref 0.450–4.500)
Total Protein: 6.7 g/dL (ref 6.0–8.5)
Triglycerides: 58 mg/dL (ref 0–149)
Uric Acid: 6.1 mg/dL (ref 3.8–8.4)
VLDL Cholesterol Cal: 13 mg/dL (ref 5–40)
WBC: 9.4 10*3/uL (ref 3.4–10.8)
eGFR: 70 mL/min/{1.73_m2} (ref >59)

## 2020-06-29 LAB — HGB A1C W/O EAG: Hgb A1c MFr Bld: 5.7 % — ABNORMAL HIGH (ref 4.8–5.6)

## 2020-06-29 LAB — MICROALBUMIN / CREATININE URINE RATIO
Creatinine, Urine: 172 mg/dL
Microalb/Creat Ratio: 3 mg/g creat (ref 0–29)
Microalbumin, Urine: 5.1 ug/mL

## 2020-07-05 ENCOUNTER — Encounter: Payer: Self-pay | Admitting: Adult Health

## 2020-07-05 ENCOUNTER — Other Ambulatory Visit: Payer: Self-pay

## 2020-07-05 ENCOUNTER — Ambulatory Visit: Payer: Self-pay | Admitting: Adult Health

## 2020-07-05 VITALS — BP 131/85 | HR 83 | Temp 97.6°F | Resp 14 | Ht 71.0 in | Wt 171.0 lb

## 2020-07-05 DIAGNOSIS — Z0000 Encounter for general adult medical examination without abnormal findings: Secondary | ICD-10-CM

## 2020-07-05 DIAGNOSIS — E119 Type 2 diabetes mellitus without complications: Secondary | ICD-10-CM

## 2020-07-05 DIAGNOSIS — G4733 Obstructive sleep apnea (adult) (pediatric): Secondary | ICD-10-CM

## 2020-07-05 DIAGNOSIS — E7849 Other hyperlipidemia: Secondary | ICD-10-CM

## 2020-07-05 NOTE — Progress Notes (Signed)
[facility] [place] [address] [place], [place] [address]  Internal MEDICINE  Office Visit Note  Patient Name: Jack Marshall  [id number]  [id number]  Date of Service: 07/05/2020  Chief Complaint  Patient presents with  . Annual Exam    Pt states he thinks he keeps having an in grown toe nail on big toe of right foot. It happens periodically and he describes as pain and numbness. CL,RMA     HPI Pt is here for routine health maintenance examination.  Jack Marshall is a 60 yo AA male who has been married to his second wife for 6 years.  He has a 14 year old daughter.  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  He also works part time at [employer]  His medical history is reviewed.  He currently takes Metformin for DM, and Atorvastatin for cholesterol. He also reports a history of sleep apnea, but he has not worn a CPAP in many years.  His wife is complaining of loud snoring at times and he has some excessive daytime fatigue.      Current Medication: Outpatient Encounter Medications as of 07/05/2020  Medication Sig  . atorvastatin (LIPITOR) 10 MG tablet Take 1 tablet (10 mg total) by mouth daily.  . metFORMIN (GLUCOPHAGE) 1000 MG tablet Take 1 tablet (1,000 mg total) by mouth 2 (two) times daily with a meal.  . Multiple Vitamin (MULTIVITAMIN) capsule Take 1 capsule by mouth daily.  . [DISCONTINUED] aspirin 81 MG chewable tablet Chew 81 mg by mouth daily.   No facility-administered encounter medications on file as of 07/05/2020.    Surgical History: Past Surgical History:  Procedure Laterality Date  . COLONOSCOPY     normal colon  . COLONOSCOPY W/ POLYPECTOMY  12/09/2005   9 mm TA in ascending colon  . COLONOSCOPY WITH PROPOFOL N/A 09/23/2018   Procedure: COLONOSCOPY WITH PROPOFOL;  Surgeon: Toledo, Benay Pike, MD;  Location: [facility] ENDOSCOPY;  Service: Gastroenterology;  Laterality: N/A;  . EYE SURGERY    . TESTICULAR EXPLORATION       Medical History: Past Medical History:  Diagnosis Date  . Colon polyp    2009, most recent in 2020 no polyps  . 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 Diabetes mellitus without complication ([place])    type 2  . Family history of colon cancer   . Hyperlipidemia   . Lack of exercise   . Overweight   . Seasonal allergies   . Sleep apnea    does not wear cpap  . Testicular mass    right   . Ventral hernia     Family History: Family History  Problem Relation Age of Onset  . Diabetes Mother   . Colon cancer Father   . Prostate cancer Father   . Colon cancer Brother     Social History: Social History   Socioeconomic History  . Marital status: Married    Spouse name: Not on file  . Number of children: Not on file  . Years of education: Not on file  . Highest education level: Not on file  Occupational History  . Not on file  Tobacco Use  . Smoking status: Never Smoker  . Smokeless tobacco: Never Used  Vaping Use  . Vaping Use: Never used  Substance and Sexual Activity  . Alcohol use: Yes  . Drug use: Not Currently  .  Sexual activity: Not on file  Other Topics Concern  . Not on file  Social History Narrative  . Not on file   Social Determinants of Health   Financial Resource Strain: Not on file  Food Insecurity: Not on file  Transportation Needs: Not on file  Physical Activity: Not on file  Stress: Not on file  Social Connections: Not on file      Review of Systems  Constitutional: Negative for activity change, appetite change and fatigue.  HENT: Negative for congestion, sinus pain, trouble swallowing and voice change.   Eyes: Negative for pain, discharge and visual disturbance.  Respiratory: Negative for cough, chest tightness and shortness of breath.   Cardiovascular: Negative for chest pain and leg swelling.  Gastrointestinal: Negative for abdominal distention, abdominal pain, constipation and diarrhea.  Musculoskeletal: Negative  for arthralgias, back pain and neck pain.  Skin: Negative for color change.  Neurological: Negative for dizziness, weakness and headaches.  Hematological: Negative for adenopathy.  Psychiatric/Behavioral: Negative for agitation, confusion and suicidal ideas.     Vital Signs: BP 131/85   Pulse 83   Temp 97.6 F (36.4 C)   Resp 14   Ht _0  (1.803 m)   Wt 171 lb (77.6 kg)   SpO2 96%   BMI 23.85 kg/m    Physical Exam Constitutional:      Appearance: Normal appearance.  HENT:     Head: Normocephalic.     Right Ear: Tympanic membrane normal.     Left Ear: Tympanic membrane normal.     Nose: Nose normal.     Mouth/Throat:     Mouth: Mucous membranes are moist.     Pharynx: No oropharyngeal exudate or posterior oropharyngeal erythema.  Eyes:     General:        Right eye: No discharge.        Left eye: No discharge.     Extraocular Movements: Extraocular movements intact.     Pupils: Pupils are equal, round, and reactive to light.  Cardiovascular:     Rate and Rhythm: Normal rate and regular rhythm.     Pulses: Normal pulses.     Heart sounds: Normal heart sounds. No murmur heard.   Pulmonary:     Effort: Pulmonary effort is normal. No respiratory distress.     Breath sounds: Normal breath sounds. No wheezing or rhonchi.  Abdominal:     General: Abdomen is flat. Bowel sounds are normal. There is no distension.     Palpations: There is no mass.     Tenderness: There is no abdominal tenderness. There is no guarding.     Hernia: No hernia is present.  Musculoskeletal:        General: No swelling or deformity. Normal range of motion.     Cervical back: Normal range of motion.  Feet:     Right foot:     Toenail Condition: Right toenails are abnormally thick.     Left foot:     Toenail Condition: Left toenails are abnormally thick.     Comments: Right great toe does not appear to be ingrown as patient suggest.  Skin:    General: Skin is warm and dry.     Capillary  Refill: Capillary refill takes less than 2 seconds.  Neurological:     General: No focal deficit present.     Mental Status: He is alert.     Cranial Nerves: No cranial nerve deficit.     Gait: Gait  normal.  Psychiatric:        Mood and Affect: Mood normal.        Behavior: Behavior normal.        Judgment: Judgment normal.      LABS: Recent Results (from the past 2160 hour(s))  Hgb A1c w/o eAG     Status: Abnormal   Collection Time: 06/28/20  8:44 AM  Result Value Ref Range   Hgb A1c MFr Bld 5.7 (H) 4.8 - 5.6 %    Comment:          Prediabetes: 5.7 - 6.4          Diabetes: >6.4          Glycemic control for adults with diabetes: <7.0   CMP12+LP+TP+TSH+6AC+PSA+CBC.     Status: Abnormal   Collection Time: 06/28/20  8:44 AM  Result Value Ref Range   Glucose 100 (H) 65 - 99 mg/dL   Uric Acid 6.1 3.8 - 8.4 mg/dL    Comment:            Therapeutic target for gout patients: <6.0   BUN 17 6 - 24 mg/dL   Creatinine, Ser 1.20 0.76 - 1.27 mg/dL   eGFR 70 >59 mL/min/1.73   BUN/Creatinine Ratio 14 9 - 20   Sodium 138 134 - 144 mmol/L   Potassium 4.4 3.5 - 5.2 mmol/L   Chloride 100 96 - 106 mmol/L   Calcium 10.1 8.7 - 10.2 mg/dL   Phosphorus 4.1 2.8 - 4.1 mg/dL   Total Protein 6.7 6.0 - 8.5 g/dL   Albumin 4.5 3.8 - 4.9 g/dL   Globulin, Total 2.2 1.5 - 4.5 g/dL   Albumin/Globulin Ratio 2.0 1.2 - 2.2   Bilirubin Total 0.4 0.0 - 1.2 mg/dL   Alkaline Phosphatase 60 44 - 121 IU/L   LDH 119 (L) 121 - 224 IU/L   AST 36 0 - 40 IU/L   ALT 25 0 - 44 IU/L   GGT 23 0 - 65 IU/L   Iron 50 38 - 169 ug/dL   Cholesterol, Total 127 100 - 199 mg/dL   Triglycerides 58 0 - 149 mg/dL   HDL 57 >39 mg/dL   VLDL Cholesterol Cal 13 5 - 40 mg/dL   LDL Chol Calc (NIH) 57 0 - 99 mg/dL   Chol/HDL Ratio 2.2 0.0 - 5.0 ratio    Comment:                                   T. Chol/HDL Ratio                                             Men  Women                               1/2 Avg.Risk  3.4    3.3                                    Avg.Risk  5.0    4.4  2X Avg.Risk  9.6    7.1                                3X Avg.Risk 23.4   11.0    Estimated CHD Risk  < 0.5 0.0 - 1.0 times avg.    Comment: The CHD Risk is based on the T. Chol/HDL ratio. Other factors affect CHD Risk such as hypertension, smoking, diabetes, severe obesity, and family history of premature CHD.    TSH 2.500 0.450 - 4.500 uIU/mL   T4, Total 7.8 4.5 - 12.0 ug/dL   T3 Uptake Ratio 27 24 - 39 %   Free Thyroxine Index 2.1 1.2 - 4.9   Prostate Specific Ag, Serum 3.3 0.0 - 4.0 ng/mL    Comment: Roche ECLIA methodology. According to the American Urological Association, Serum PSA should decrease and remain at undetectable levels after radical prostatectomy. The AUA defines biochemical recurrence as an initial PSA value 0.2 ng/mL or greater followed by a subsequent confirmatory PSA value 0.2 ng/mL or greater. Values obtained with different assay methods or kits cannot be used interchangeably. Results cannot be interpreted as absolute evidence of the presence or absence of malignant disease.    WBC 9.4 3.4 - 10.8 x10E3/uL   RBC 4.32 4.14 - 5.80 x10E6/uL   Hemoglobin 13.4 13.0 - 17.7 g/dL   Hematocrit 40.0 37.5 - 51.0 %   MCV 93 79 - 97 fL   MCH 31.0 26.6 - 33.0 pg   MCHC 33.5 31.5 - 35.7 g/dL   RDW 11.7 11.6 - 15.4 %   Platelets 262 150 - 450 x10E3/uL   Neutrophils 50 Not Estab. %   Lymphs 38 Not Estab. %   Monocytes 10 Not Estab. %   Eos 2 Not Estab. %   Basos 0 Not Estab. %   Neutrophils Absolute 4.6 1.4 - 7.0 x10E3/uL   Lymphocytes Absolute 3.6 (H) 0.7 - 3.1 x10E3/uL   Monocytes Absolute 1.0 (H) 0.1 - 0.9 x10E3/uL   EOS (ABSOLUTE) 0.2 0.0 - 0.4 x10E3/uL   Basophils Absolute 0.0 0.0 - 0.2 x10E3/uL   Immature Granulocytes 0 Not Estab. %   Immature Grans (Abs) 0.0 0.0 - 0.1 x10E3/uL  Microalbumin / creatinine urine ratio     Status: None   Collection Time: 06/28/20  9:11 AM  Result  Value Ref Range   Creatinine, Urine 172.0 Not Estab. mg/dL   Microalbumin, Urine 5.1 Not Estab. ug/mL   Microalb/Creat Ratio 3 0 - 29 mg/g creat    Comment:                        Normal:                0 -  29                        Moderately increased: 30 - 300                        Severely increased:       >300   POCT urinalysis dipstick     Status: Normal   Collection Time: 06/28/20  9:45 AM  Result Value Ref Range   Color, UA dark yellow    Clarity, UA clear    Glucose, UA Negative Negative   Bilirubin, UA negative    Ketones,  UA negative    Spec Grav, UA 1.025 1.010 - 1.025   Blood, UA negative    pH, UA 5.5 5.0 - 8.0   Protein, UA Negative Negative   Urobilinogen, UA 0.2 0.2 or 1.0 E.U./dL   Nitrite, UA negative    Leukocytes, UA Negative Negative   Appearance dark    Odor       Assessment/Plan: 1. Annual physical exam Discussed need/importance of eye exam yearly for patients with DM. He has financial constraints with doing yearly visits.   2. Type 2 diabetes mellitus without complication, without long-term current use of insulin ([place]) Diabetic Foot exam performed and documented in screening. Current A1C is 5.7, continue current dose of Metformin as patient is well controlled, and has no side effects from medications. Discussed surveillance of A1C every 90-180 days.   3. Other hyperlipidemia Continue Atorvastatin, no side effects.  Cholesterol is WNL.     4. OSA (obstructive sleep apnea) Patient has personal history of OSA, He has not worn a cpap in many years. Will refer to [facility] for sleep study, and evaluation of need for new cpap.     General Counseling: samin milke understanding of the findings of todays visit and agrees with plan of treatment. I have discussed any further diagnostic evaluation that may be needed or ordered today. We also reviewed his medications today. he has been encouraged to call the office with any questions or concerns  that should arise related to todays visit.    No orders of the defined types were placed in this encounter.   No orders of the defined types were placed in this encounter.   Total time spent: 40 Minutes  Time spent includes review of chart, medications, test results, and follow up plan with the patient.    Kendell Bane AGNP-C Nurse Practitioner

## 2020-07-21 ENCOUNTER — Other Ambulatory Visit: Payer: Self-pay | Admitting: Emergency Medicine

## 2020-07-21 DIAGNOSIS — E7849 Other hyperlipidemia: Secondary | ICD-10-CM

## 2020-10-24 ENCOUNTER — Ambulatory Visit: Payer: Self-pay | Admitting: Physician Assistant

## 2020-10-24 ENCOUNTER — Other Ambulatory Visit: Payer: Self-pay

## 2020-10-24 ENCOUNTER — Encounter: Payer: Self-pay | Admitting: Physician Assistant

## 2020-10-24 VITALS — BP 119/89 | HR 114 | Resp 12 | Ht 71.0 in | Wt 165.0 lb

## 2020-10-24 DIAGNOSIS — M6530 Trigger finger, unspecified finger: Secondary | ICD-10-CM

## 2020-10-24 DIAGNOSIS — E119 Type 2 diabetes mellitus without complications: Secondary | ICD-10-CM

## 2020-10-24 LAB — POCT GLYCOSYLATED HEMOGLOBIN (HGB A1C): HbA1c POC (<> result, manual entry): 5 % (ref 4.0–5.6)

## 2020-10-24 MED ORDER — METFORMIN HCL 1000 MG PO TABS: 1000.0000 mg | ORAL_TABLET | Freq: Two times a day (BID) | ORAL | 3 refills | Status: DC

## 2020-10-24 NOTE — Progress Notes (Signed)
   Subjective: Diabetes and trigger finger fourth digit left hand    Patient ID: Jack Marshall, male    DOB: 11/12/1960, 60 y.o.   MRN: [id number]  HPI Patient follow-up 3 months hemoglobin A1c secondary to diabetes.  Patient stated out of medication for 1 week.  Patient also complain of "locking" episodes of the fourth digit right hand for approximately 3 weeks.  Patient is right-hand dominant.   Review of Systems Diabetes and hyperlipidemia.  Locking episodes fourth digit right dominant hand.    Objective:   Physical Exam  No acute distress.  Temperature 98.9.  Pulse is tacky at 114.  Respiration 12, BP is 119/89 and patient is 96% O2 sat on room air.  Patient was on 65 pounds. No obvious deformity to the fourth digit right hand.  Patient is demonstrating free and range of motion of the digit at this time.  Hemoglobin A1c was 5.0.      Assessment & Plan: Diabetes   Patient diabetes shows good control with metformin at 1000 mg twice daily.  Unable to reproduce locking sensation or episode on this visit.  Patient will be consulted to orthopedics for definitive evaluation and treatment.

## 2020-10-24 NOTE — Progress Notes (Signed)
Pt stating he might be experiencing trigger finger on right hand middle finger. He states its sensitive pain but hasn't locked up yet.

## 2020-10-25 NOTE — Addendum Note (Signed)
Addended by: Gardner Candle on: 10/25/2020 04:53 PM   Modules accepted: Orders

## 2020-10-26 NOTE — Addendum Note (Signed)
Addended by: Gardner Candle on: 10/26/2020 08:33 AM   Modules accepted: Orders

## 2020-11-15 DIAGNOSIS — M65341 Trigger finger, right ring finger: Secondary | ICD-10-CM | POA: Diagnosis not present

## 2021-06-22 ENCOUNTER — Ambulatory Visit: Payer: Self-pay

## 2021-06-22 DIAGNOSIS — Z0000 Encounter for general adult medical examination without abnormal findings: Secondary | ICD-10-CM

## 2021-06-22 LAB — POCT URINALYSIS DIPSTICK
Bilirubin, UA: NEGATIVE
Blood, UA: NEGATIVE
Glucose, UA: NEGATIVE
Ketones, UA: NEGATIVE
Leukocytes, UA: NEGATIVE
Nitrite, UA: NEGATIVE
Protein, UA: NEGATIVE
Spec Grav, UA: 1.025 (ref 1.010–1.025)
Urobilinogen, UA: 0.2 E.U./dL
pH, UA: 5.5 (ref 5.0–8.0)

## 2021-06-24 LAB — CMP12+LP+TP+TSH+6AC+PSA+CBC…
ALT: 28 IU/L (ref 0–44)
AST: 21 IU/L (ref 0–40)
Albumin/Globulin Ratio: 1.6 (ref 1.2–2.2)
Albumin: 4.4 g/dL (ref 3.8–4.9)
Alkaline Phosphatase: 74 IU/L (ref 44–121)
BUN/Creatinine Ratio: 12 (ref 9–20)
BUN: 13 mg/dL (ref 6–24)
Basophils Absolute: 0 10*3/uL (ref 0.0–0.2)
Basos: 1 %
Bilirubin Total: 0.5 mg/dL (ref 0.0–1.2)
Calcium: 9.5 mg/dL (ref 8.7–10.2)
Chloride: 103 mmol/L (ref 96–106)
Chol/HDL Ratio: 2.6 ratio (ref 0.0–5.0)
Cholesterol, Total: 141 mg/dL (ref 100–199)
Creatinine, Ser: 1.12 mg/dL (ref 0.76–1.27)
EOS (ABSOLUTE): 0.1 10*3/uL (ref 0.0–0.4)
Eos: 1 %
Estimated CHD Risk: <0.5 times avg. (ref 0.0–1.0)
Free Thyroxine Index: 1.7 (ref 1.2–4.9)
GGT: 24 IU/L (ref 0–65)
Globulin, Total: 2.7 g/dL (ref 1.5–4.5)
Glucose: 171 mg/dL — ABNORMAL HIGH (ref 70–99)
HDL: 55 mg/dL (ref >39)
Hematocrit: 41.2 % (ref 37.5–51.0)
Hemoglobin: 14.3 g/dL (ref 13.0–17.7)
Immature Grans (Abs): 0 10*3/uL (ref 0.0–0.1)
Immature Granulocytes: 0 %
Iron: 44 ug/dL (ref 38–169)
LDH: 108 IU/L — ABNORMAL LOW (ref 121–224)
LDL Chol Calc (NIH): 63 mg/dL (ref 0–99)
Lymphocytes Absolute: 2.8 10*3/uL (ref 0.7–3.1)
Lymphs: 34 %
MCH: 31.1 pg (ref 26.6–33.0)
MCHC: 34.7 g/dL (ref 31.5–35.7)
MCV: 90 fL (ref 79–97)
Monocytes Absolute: 1.1 10*3/uL — ABNORMAL HIGH (ref 0.1–0.9)
Monocytes: 14 %
Neutrophils Absolute: 4.2 10*3/uL (ref 1.4–7.0)
Neutrophils: 50 %
Phosphorus: 3.5 mg/dL (ref 2.8–4.1)
Platelets: 247 10*3/uL (ref 150–450)
Potassium: 4.2 mmol/L (ref 3.5–5.2)
Prostate Specific Ag, Serum: 3.7 ng/mL (ref 0.0–4.0)
RBC: 4.6 x10E6/uL (ref 4.14–5.80)
RDW: 11.4 % — ABNORMAL LOW (ref 11.6–15.4)
Sodium: 141 mmol/L (ref 134–144)
T3 Uptake Ratio: 26 % (ref 24–39)
T4, Total: 6.7 ug/dL (ref 4.5–12.0)
TSH: 0.762 u[IU]/mL (ref 0.450–4.500)
Total Protein: 7.1 g/dL (ref 6.0–8.5)
Triglycerides: 129 mg/dL (ref 0–149)
Uric Acid: 4.4 mg/dL (ref 3.8–8.4)
VLDL Cholesterol Cal: 23 mg/dL (ref 5–40)
WBC: 8.2 10*3/uL (ref 3.4–10.8)
eGFR: 76 mL/min/{1.73_m2} (ref >59)

## 2021-06-24 LAB — MICROALBUMIN / CREATININE URINE RATIO
Creatinine, Urine: 115.5 mg/dL
Microalb/Creat Ratio: 6 mg/g creat (ref 0–29)
Microalbumin, Urine: 7.4 ug/mL

## 2021-06-24 LAB — HGB A1C W/O EAG: Hgb A1c MFr Bld: 5.7 % — ABNORMAL HIGH (ref 4.8–5.6)

## 2021-06-27 ENCOUNTER — Encounter: Payer: Self-pay | Admitting: Physician Assistant

## 2021-06-27 ENCOUNTER — Ambulatory Visit: Payer: Self-pay | Admitting: Physician Assistant

## 2021-06-27 VITALS — BP 129/81 | HR 96 | Temp 97.7°F | Resp 12 | Ht 71.0 in | Wt 161.0 lb

## 2021-06-27 DIAGNOSIS — Z0000 Encounter for general adult medical examination without abnormal findings: Secondary | ICD-10-CM

## 2021-06-27 NOTE — Progress Notes (Signed)
? ?[facility] ? ?____________________________________________ ? ? None  ?  (approximate) ? ?I have reviewed the triage vital signs and the nursing notes. ? ? ?HISTORY ? ?Chief Complaint ?Annual Exam ? ? ?HPI ?Jack Marshall is a 61 y.o. male patient presents for annual physical exam.  Patient has a history of diabetes, hyperlipidemia and hypertension ?   ? ?  ? ?Past Medical History:  ?Diagnosis Date  ? Colon polyp   ? 2009, most recent in 2020 no polyps  ? Decreased vision of right eye   ? due to an accident   ? Diabetes mellitus without complication ([place])   ? type 2  ? Family history of colon cancer   ? Hyperlipidemia   ? Lack of exercise   ? Overweight   ? Seasonal allergies   ? Sleep apnea   ? does not wear cpap  ? Testicular mass   ? right   ? Ventral hernia   ? ? ?Patient Active Problem List  ? Diagnosis Date Noted  ? Type 2 diabetes mellitus without complication, without long-term current use of insulin ([place]) 10/20/2018  ? Elevated BP without diagnosis of hypertension 10/20/2018  ? Other hyperlipidemia 10/20/2018  ? Erectile dysfunction 10/20/2018  ? Family history of colon cancer 10/20/2018  ? ? ?Past Surgical History:  ?Procedure Laterality Date  ? COLONOSCOPY    ? normal colon  ? COLONOSCOPY W/ POLYPECTOMY  12/09/2005  ? 9 mm TA in ascending colon  ? COLONOSCOPY WITH PROPOFOL N/A 09/23/2018  ? Procedure: COLONOSCOPY WITH PROPOFOL;  Surgeon: Toledo, Benay Pike, MD;  Location: [facility] ENDOSCOPY;  Service: Gastroenterology;  Laterality: N/A;  ? EYE SURGERY    ? TESTICULAR EXPLORATION    ? ? ?Prior to Admission medications   ?Medication Sig Start Date End Date Taking? Authorizing Provider  ?atorvastatin (LIPITOR) 10 MG tablet Take 1 tablet (10 mg total) by mouth daily. 07/21/20  Yes Kendell Bane, NP  ?metFORMIN (GLUCOPHAGE) 1000 MG tablet Take 1 tablet (1,000 mg total) by mouth 2 (two) times daily with a meal. 10/24/20  Yes Sable Feil, PA-C  ?Multiple Vitamin  (MULTIVITAMIN) capsule Take 1 capsule by mouth daily.   Yes [provider]  ? ? ?Allergies ?Patient has no known allergies. ? ?Family History  ?Problem Relation Age of Onset  ? Diabetes Mother   ? Colon cancer Father   ? Prostate cancer Father   ? Colon cancer Brother   ? ? ?Social History ?Social History  ? ?Tobacco Use  ? Smoking status: Never  ? Smokeless tobacco: Never  ?Vaping Use  ? Vaping Use: Never used  ?Substance Use Topics  ? Alcohol use: Yes  ? Drug use: Not Currently  ? ? ?Review of Systems ?Constitutional: No fever/chills ?Eyes: No visual changes. ?ENT: No sore throat. ?Cardiovascular: Denies chest pain. ?Respiratory: Denies shortness of breath. ?Gastrointestinal: No abdominal pain.  No nausea, no vomiting.  No diarrhea.  No constipation. ?Genitourinary: Negative for dysuria. ?Musculoskeletal: Negative for back pain. ?Skin: Negative for rash. ?Neurological: Negative for headaches, focal weakness or numbness. ?Endocrine: Diabetes, hyperlipidemia, hypertension. ? ?____________________________________________ ? ? ?PHYSICAL EXAM: ? ?VITAL SIGNS: BP is 129/81, pulse 96, respiration 12, temp 97.7, patient 97% O2 sat on room air.  Patient weighs 161 pounds and BMI is 22.45. ?Constitutional: Alert and oriented. Well appearing and in no acute distress. ?Eyes: Conjunctivae are normal. PERRL. EOMI. ?Head: Atraumatic. ?Nose: No congestion/rhinnorhea. ?Mouth/Throat: Mucous membranes are moist.  Oropharynx  non-erythematous. ?Neck: No stridor.  No cervical spine tenderness to palpation. ?Hematological/Lymphatic/Immunilogical: No cervical lymphadenopathy. ?Cardiovascular: Normal rate, regular rhythm. Grossly normal heart sounds.  Good peripheral circulation. ?Respiratory: Normal respiratory effort.  No retractions. Lungs CTAB. ?Gastrointestinal: Soft and nontender. No distention. No abdominal bruits. No CVA tenderness. ?Genitourinary: Deferred ?Musculoskeletal: No lower extremity tenderness nor edema.  No  joint effusions. ?Neurologic:  Normal speech and language. No gross focal neurologic deficits are appreciated. No gait instability. ?Skin:  Skin is warm, dry and intact. No rash noted. ?Psychiatric: Mood and affect are normal. Speech and behavior are normal. ? ?____________________________________________ ?  ?LABS ? ?___________________________________________ ? ?EKG ?Sinus  Rhythm at 93 bpm ?-RSR(V1) -nondiagnostic.  ? ?PROBABLY NORMAL ? ?____________________________________________ ? ? ? ?____________________________________________ ? ? ?INITIAL IMPRESSION / ASSESSMENT AND PLAN  ?As part of my medical decision making, I reviewed the following data within the [facility]  ? ?   ?Discussed no acute findings on lab and EKG.  Patient diabetic foot exam was unremarkable. ?  ? ? ? ? ? ?____________________________________________ ? ? ?FINAL CLINICAL IMPRESSION ?Well exam ? ? ?ED Discharge Orders   ? ? None  ? ?  ? ? ? ?Note:  This document was prepared using Dragon voice recognition software and may include unintentional dictation errors. ? ?

## 2021-06-27 NOTE — Progress Notes (Signed)
pt presents today to complete physical, Pt denies any issues or concerns at this time./CL,RMA ?

## 2021-07-26 ENCOUNTER — Other Ambulatory Visit: Payer: Self-pay | Admitting: Adult Health

## 2021-07-26 DIAGNOSIS — E7849 Other hyperlipidemia: Secondary | ICD-10-CM

## 2021-10-24 ENCOUNTER — Other Ambulatory Visit: Payer: Self-pay | Admitting: Physician Assistant

## 2021-10-24 DIAGNOSIS — E7849 Other hyperlipidemia: Secondary | ICD-10-CM

## 2021-11-05 ENCOUNTER — Other Ambulatory Visit: Payer: Self-pay

## 2021-11-05 DIAGNOSIS — E7849 Other hyperlipidemia: Secondary | ICD-10-CM

## 2021-11-05 MED ORDER — ATORVASTATIN CALCIUM 10 MG PO TABS: 10.0000 mg | ORAL_TABLET | Freq: Every day | ORAL | 0 refills | Status: DC

## 2021-11-20 ENCOUNTER — Other Ambulatory Visit: Payer: Self-pay

## 2021-11-20 DIAGNOSIS — E119 Type 2 diabetes mellitus without complications: Secondary | ICD-10-CM

## 2021-11-21 MED ORDER — METFORMIN HCL 1000 MG PO TABS: 1000.0000 mg | ORAL_TABLET | Freq: Two times a day (BID) | ORAL | 3 refills | Status: DC

## 2022-02-15 ENCOUNTER — Other Ambulatory Visit: Payer: Self-pay

## 2022-02-15 DIAGNOSIS — E7849 Other hyperlipidemia: Secondary | ICD-10-CM

## 2022-02-16 MED ORDER — ATORVASTATIN CALCIUM 10 MG PO TABS: 10.0000 mg | ORAL_TABLET | Freq: Every day | ORAL | 3 refills | Status: DC

## 2022-06-13 ENCOUNTER — Ambulatory Visit: Payer: Self-pay

## 2022-06-13 DIAGNOSIS — Z0000 Encounter for general adult medical examination without abnormal findings: Secondary | ICD-10-CM

## 2022-06-13 LAB — POCT URINALYSIS DIPSTICK
Bilirubin, UA: NEGATIVE
Blood, UA: NEGATIVE
Glucose, UA: NEGATIVE
Ketones, UA: NEGATIVE
Leukocytes, UA: NEGATIVE
Nitrite, UA: NEGATIVE
Protein, UA: NEGATIVE
Spec Grav, UA: 1.02 (ref 1.010–1.025)
Urobilinogen, UA: 0.2 E.U./dL
pH, UA: 6 (ref 5.0–8.0)

## 2022-06-14 LAB — CMP12+LP+TP+TSH+6AC+PSA+CBC…
ALT: 21 IU/L (ref 0–44)
AST: 18 IU/L (ref 0–40)
Albumin/Globulin Ratio: 1.9 (ref 1.2–2.2)
Albumin: 4.5 g/dL (ref 3.8–4.9)
Alkaline Phosphatase: 62 IU/L (ref 44–121)
BUN/Creatinine Ratio: 13 (ref 10–24)
BUN: 13 mg/dL (ref 8–27)
Basophils Absolute: 0 10*3/uL (ref 0.0–0.2)
Basos: 1 %
Bilirubin Total: 0.6 mg/dL (ref 0.0–1.2)
Calcium: 10 mg/dL (ref 8.6–10.2)
Chloride: 103 mmol/L (ref 96–106)
Chol/HDL Ratio: 2.1 ratio (ref 0.0–5.0)
Cholesterol, Total: 129 mg/dL (ref 100–199)
Creatinine, Ser: 1.04 mg/dL (ref 0.76–1.27)
EOS (ABSOLUTE): 0.2 10*3/uL (ref 0.0–0.4)
Eos: 2 %
Estimated CHD Risk: <0.5 times avg. (ref 0.0–1.0)
Free Thyroxine Index: 1.8 (ref 1.2–4.9)
GGT: 21 IU/L (ref 0–65)
Globulin, Total: 2.4 g/dL (ref 1.5–4.5)
Glucose: 132 mg/dL — ABNORMAL HIGH (ref 70–99)
HDL: 61 mg/dL (ref >39)
Hematocrit: 39.4 % (ref 37.5–51.0)
Hemoglobin: 13.6 g/dL (ref 13.0–17.7)
Immature Grans (Abs): 0 10*3/uL (ref 0.0–0.1)
Immature Granulocytes: 0 %
Iron: 102 ug/dL (ref 38–169)
LDH: 91 IU/L — ABNORMAL LOW (ref 121–224)
LDL Chol Calc (NIH): 55 mg/dL (ref 0–99)
Lymphocytes Absolute: 2.9 10*3/uL (ref 0.7–3.1)
Lymphs: 38 %
MCH: 30.8 pg (ref 26.6–33.0)
MCHC: 34.5 g/dL (ref 31.5–35.7)
MCV: 89 fL (ref 79–97)
Monocytes Absolute: 0.7 10*3/uL (ref 0.1–0.9)
Monocytes: 8 %
Neutrophils Absolute: 4 10*3/uL (ref 1.4–7.0)
Neutrophils: 51 %
Phosphorus: 3.8 mg/dL (ref 2.8–4.1)
Platelets: 260 10*3/uL (ref 150–450)
Potassium: 4.3 mmol/L (ref 3.5–5.2)
Prostate Specific Ag, Serum: 4.3 ng/mL — ABNORMAL HIGH (ref 0.0–4.0)
RBC: 4.42 x10E6/uL (ref 4.14–5.80)
RDW: 11.5 % — ABNORMAL LOW (ref 11.6–15.4)
Sodium: 141 mmol/L (ref 134–144)
T3 Uptake Ratio: 27 % (ref 24–39)
T4, Total: 6.7 ug/dL (ref 4.5–12.0)
TSH: 1.21 u[IU]/mL (ref 0.450–4.500)
Total Protein: 6.9 g/dL (ref 6.0–8.5)
Triglycerides: 64 mg/dL (ref 0–149)
Uric Acid: 5.3 mg/dL (ref 3.8–8.4)
VLDL Cholesterol Cal: 13 mg/dL (ref 5–40)
WBC: 7.8 10*3/uL (ref 3.4–10.8)
eGFR: 82 mL/min/{1.73_m2} (ref >59)

## 2022-06-14 LAB — HGB A1C W/O EAG: Hgb A1c MFr Bld: 6 % — ABNORMAL HIGH (ref 4.8–5.6)

## 2022-06-14 LAB — MICROALBUMIN / CREATININE URINE RATIO
Creatinine, Urine: 139.3 mg/dL
Microalb/Creat Ratio: 4 mg/g creat (ref 0–29)
Microalbumin, Urine: 6.2 ug/mL

## 2022-06-17 ENCOUNTER — Encounter: Payer: Self-pay | Admitting: Physician Assistant

## 2022-06-20 ENCOUNTER — Encounter: Payer: 59 | Admitting: Physician Assistant

## 2022-06-26 ENCOUNTER — Ambulatory Visit: Payer: Self-pay | Admitting: Physician Assistant

## 2022-06-26 ENCOUNTER — Encounter: Payer: Self-pay | Admitting: Physician Assistant

## 2022-06-26 VITALS — BP 108/79 | HR 116 | Temp 97.1°F | Resp 12 | Ht 71.0 in | Wt 173.0 lb

## 2022-06-26 DIAGNOSIS — E7849 Other hyperlipidemia: Secondary | ICD-10-CM

## 2022-06-26 DIAGNOSIS — R972 Elevated prostate specific antigen [PSA]: Secondary | ICD-10-CM

## 2022-06-26 DIAGNOSIS — E119 Type 2 diabetes mellitus without complications: Secondary | ICD-10-CM

## 2022-06-26 DIAGNOSIS — Z0000 Encounter for general adult medical examination without abnormal findings: Secondary | ICD-10-CM

## 2022-06-26 NOTE — Addendum Note (Signed)
Addended by: Gardner Candle on: 06/26/2022 02:34 PM   Modules accepted: Orders

## 2022-06-26 NOTE — Progress Notes (Signed)
[facility]  ____________________________________________   None    (approximate)  I have reviewed the triage vital signs and the nursing notes.   HISTORY  Chief Complaint Annual Exam   HPI ANES FROSS is a 62 y.o. male patient exam.  Patient was concern for urinary nocturnal frequency.  Family history of prostate cancer.        Past Medical History:  Diagnosis Date   Colon polyp    2009, most recent in 2020 no polyps   Decreased vision of right eye    due to an accident    Diabetes mellitus without complication (HCC)    type 2   Family history of colon cancer    Hyperlipidemia    Lack of exercise    Overweight    Seasonal allergies    Sleep apnea    does not wear cpap   Testicular mass    right    Ventral hernia     Patient Active Problem List   Diagnosis Date Noted   Type 2 diabetes mellitus without complication, without long-term current use of insulin (HCC) 10/20/2018   Elevated BP without diagnosis of hypertension 10/20/2018   Other hyperlipidemia 10/20/2018   Erectile dysfunction 10/20/2018   Family history of colon cancer 10/20/2018    Past Surgical History:  Procedure Laterality Date   COLONOSCOPY     normal colon   COLONOSCOPY W/ POLYPECTOMY  12/09/2005   9 mm TA in ascending colon   COLONOSCOPY WITH PROPOFOL N/A 09/23/2018   Procedure: COLONOSCOPY WITH PROPOFOL;  Surgeon: Toledo, Boykin Nearing, MD;  Location: [facility] ENDOSCOPY;  Service: Gastroenterology;  Laterality: N/A;   EYE SURGERY     TESTICULAR EXPLORATION      Prior to Admission medications   Medication Sig Start Date End Date Taking? Authorizing Provider  atorvastatin (LIPITOR) 10 MG tablet Take 1 tablet (10 mg total) by mouth daily. 02/16/22  Yes Joni Reining, PA-C  metFORMIN (GLUCOPHAGE) 1000 MG tablet Take 1 tablet (1,000 mg total) by mouth 2 (two) times daily with a meal. 11/21/21  Yes Joni Reining, PA-C  Multiple Vitamin (MULTIVITAMIN)  capsule Take 1 capsule by mouth daily.   Yes [provider]    Allergies Patient has no known allergies.  Family History  Problem Relation Age of Onset   Diabetes Mother    Colon cancer Father    Prostate cancer Father    Colon cancer Brother     Social History Social History   Tobacco Use   Smoking status: Never   Smokeless tobacco: Never  Vaping Use   Vaping Use: Never used  Substance Use Topics   Alcohol use: Yes   Drug use: Not Currently    Review of Systems  Constitutional: No fever/chills Eyes: No visual changes. ENT: No sore throat. Cardiovascular: Denies chest pain. Respiratory: Denies shortness of breath. Gastrointestinal: No abdominal pain.  No nausea, no vomiting.  No diarrhea.  No constipation. Genitourinary: Negative for dysuria. Musculoskeletal: Negative for back pain. Skin: Negative for rash. Neurological: Negative for headaches, focal weakness or numbness. Endocrine: Diabetes and hyperlipidemia  ____________________________________________   PHYSICAL EXAM:  VITAL SIGNS: BP 108/79  BP Location Left Arm  Patient Position Sitting  Cuff Size Large  Pulse 116  Resp 12  Temp 97.1 F (36.2 C)  Temp src Temporal  SpO2 96 %  Weight 173 lb (78.5 kg)  Height 5\' 11"  (1.803 m)   BMI 24.13 kg/m2  BSA 1.98 m2    Constitutional: Alert and oriented. Well appearing and in no acute distress. Eyes: Conjunctivae are normal. PERRL. EOMI. Head: Atraumatic. Nose: No congestion/rhinnorhea. Mouth/Throat: Mucous membranes are moist.  Oropharynx non-erythematous. Neck: No stridor.  No cervical spine tenderness to palpation. Hematological/Lymphatic/Immunilogical: No cervical lymphadenopathy. Cardiovascular: Normal rate, regular rhythm. Grossly normal heart sounds.  Good peripheral circulation. Respiratory: Normal respiratory effort.  No retractions. Lungs CTAB. Gastrointestinal: Soft and nontender. No distention. No abdominal bruits. No CVA  tenderness. Genitourinary: Deferred Musculoskeletal: No lower extremity tenderness nor edema.  No joint effusions. Neurologic:  Normal speech and language. No gross focal neurologic deficits are appreciated. No gait instability. Skin:  Skin is warm, dry and intact. No rash noted. Psychiatric: Mood and affect are normal. Speech and behavior are normal.  ____________________________________________   LABS       Component Ref Range & Units 13 d ago (06/13/22) 1 yr ago (06/22/21) 1 yr ago (06/28/20) 3 yr ago (06/24/19)  Color, UA Light Yellow Amber dark yellow Yellow  Clarity, UA Clear Clear clear Clear  Glucose, UA Negative Negative Negative Negative Negative  Bilirubin, UA Negative Negative negative Negative  Ketones, UA Negative Negative negative Negative  Spec Grav, UA 1.010 - 1.025 1.020 1.025 1.025 >=1.030 Abnormal   Blood, UA Negative Negative negative Negative  pH, UA 5.0 - 8.0 6.0 5.5 5.5 6.0  Protein, UA Negative Negative Negative Negative Negative  Urobilinogen, UA 0.2 or 1.0 E.U./dL 0.2 0.2 0.2 0.2  Nitrite, UA Negative Negative negative Negative  Leukocytes, UA Negative Negative Negative Negative Negative  Appearance   dark   Odor                  View All Conversations on this Encounter                        Component Ref Range & Units 13 d ago (06/13/22) 1 yr ago (06/22/21) 1 yr ago (06/28/20) 2 yr ago (12/29/19) 3 yr ago (06/24/19) 3 yr ago (10/20/18) 3 yr ago (07/14/18)  Glucose 70 - 99 mg/dL 161 High  096 High  045 High  R  114 High  R 110 High  R 279 High  R  Uric Acid 3.8 - 8.4 mg/dL 5.3 4.4 CM 6.1 CM  4.6 CM  5.3 R, CM  Comment:            Therapeutic target for gout patients: <6.0  BUN 8 - 27 mg/dL 13 13 R 17 R 11 R 13 R 12 R 16 R  Creatinine, Ser 0.76 - 1.27 mg/dL 4.09 8.11 9.14 7.82 9.56 1.19 1.01  eGFR >59 mL/min/1.73 82 76 70      BUN/Creatinine Ratio 10 - 24 13 12  R 14 R 10 R 11 R 10 R 16 R  Sodium 134 - 144 mmol/L 141 141 138  139  140 135  Potassium 3.5 - 5.2 mmol/L 4.3 4.2 4.4  4.2 4.6 4.3  Chloride 96 - 106 mmol/L 103 103 100  104 98 99  Calcium 8.6 - 10.2 mg/dL 21.3 9.5 R 08.6 R  9.6 R 10.3 High  R 10.1 R  Phosphorus 2.8 - 4.1 mg/dL 3.8 3.5 4.1  3.9  3.6  Total Protein 6.0 - 8.5 g/dL 6.9 7.1 6.7  7.1  7.1  Albumin 3.8 - 4.9 g/dL 4.5 4.4 4.5  4.4  4.5  Globulin, Total 1.5 - 4.5 g/dL 2.4 2.7 2.2  2.7  2.6  Albumin/Globulin Ratio 1.2 - 2.2 1.9 1.6 2.0  1.6  1.7  Bilirubin Total 0.0 - 1.2 mg/dL 0.6 0.5 0.4  0.3  0.8  Alkaline Phosphatase 44 - 121 IU/L 62 74 60  73 R, CM  89 R  LDH 121 - 224 IU/L 91 Low  108 Low  119 Low   154  117 Low   AST 0 - 40 IU/L 18 21 36  25  25  ALT 0 - 44 IU/L 21 28 25  25   38  GGT 0 - 65 IU/L 21 24 23  29   41  Iron 38 - 169 ug/dL 161 44 50  75  096  Cholesterol, Total 100 - 199 mg/dL 045 409 811  914  782 High   Triglycerides 0 - 149 mg/dL 64 956 58  97  213 High   HDL >39 mg/dL 61 55 57  53  50  VLDL Cholesterol Cal 5 - 40 mg/dL 13 23 13  18   36  LDL Chol Calc (NIH) 0 - 99 mg/dL 55 63 57  73    Chol/HDL Ratio 0.0 - 5.0 ratio 2.1 2.6 CM 2.2 CM  2.7 CM  4.6 CM  Comment:                                   T. Chol/HDL Ratio                                             Men  Women                               1/2 Avg.Risk  3.4    3.3                                   Avg.Risk  5.0    4.4                                2X Avg.Risk  9.6    7.1                                3X Avg.Risk 23.4   11.0  Estimated CHD Risk 0.0 - 1.0 times avg.  < 0.5  < 0.5 CM  < 0.5 CM   < 0.5 CM  0.9 CM  Comment: The CHD Risk is based on the T. Chol/HDL ratio. Other factors affect CHD Risk such as hypertension, smoking, diabetes, severe obesity, and family history of premature CHD.  TSH 0.450 - 4.500 uIU/mL 1.210 0.762 2.500  2.290  1.730  T4, Total 4.5 - 12.0 ug/dL 6.7 6.7 7.8  7.0  7.0  T3 Uptake Ratio 24 - 39 % 27 26 27  27  26   Free Thyroxine Index 1.2 - 4.9 1.8 1.7 2.1  1.9  1.8   Prostate Specific Ag, Serum 0.0 - 4.0 ng/mL 4.3 High  3.7 CM 3.3 CM  2.4 CM  1.9 CM  Comment: Roche ECLIA methodology. According to the American Urological Association, Serum PSA  should decrease and remain at undetectable levels after radical prostatectomy. The AUA defines biochemical recurrence as an initial PSA value 0.2 ng/mL or greater followed by a subsequent confirmatory PSA value 0.2 ng/mL or greater. Values obtained with different assay methods or kits cannot be used interchangeably. Results cannot be interpreted as absolute evidence of the presence or absence of malignant disease.  WBC 3.4 - 10.8 x10E3/uL 7.8 8.2 9.4  8.4  7.0  RBC 4.14 - 5.80 x10E6/uL 4.42 4.60 4.32  4.67  4.62  Hemoglobin 13.0 - 17.7 g/dL 08.6 57.8 46.9  62.9  52.8  Hematocrit 37.5 - 51.0 % 39.4 41.2 40.0  41.5  41.1  MCV 79 - 97 fL 89 90 93  89  89  MCH 26.6 - 33.0 pg 30.8 31.1 31.0  30.4  30.3  MCHC 31.5 - 35.7 g/dL 41.3 24.4 01.0  27.2  53.6  RDW 11.6 - 15.4 % 11.5 Low  11.4 Low  11.7  11.8  11.9  Platelets 150 - 450 x10E3/uL 260 247 262  265  254  Neutrophils Not Estab. % 51 50 50  44  49  Lymphs Not Estab. % 38 34 38  45  41  Monocytes Not Estab. % 8 14 10  8  9   Eos Not Estab. % 2 1 2  2  1   Basos Not Estab. % 1 1 0  1  0  Neutrophils Absolute 1.4 - 7.0 x10E3/uL 4.0 4.2 4.6  3.7  3.4  Lymphocytes Absolute 0.7 - 3.1 x10E3/uL 2.9 2.8 3.6 High   3.8 High   2.9  Monocytes Absolute 0.1 - 0.9 x10E3/uL 0.7 1.1 High  1.0 High   0.7  0.6  EOS (ABSOLUTE) 0.0 - 0.4 x10E3/uL 0.2 0.1 0.2  0.2  0.1  Basophils Absolute 0.0 - 0.2 x10E3/uL 0.0 0.0 0.0  0.0  0.0  Immature Granulocytes Not Estab. % 0 0 0  0  0  Immature Grans (Abs)             ____________________________________________  EKG  Sinus rhythm at 90 bpm.  Occasional ectopic ventricular beats. ____________________________________________    ____________________________________________   INITIAL IMPRESSION / ASSESSMENT AND  PLAN As part of my medical decision making, I reviewed the following data within the electronic MEDICAL RECORD NUMBER       No acute findings on physical exam.  Patient PSA is elevated.  Consult to urologist is pending.      ____________________________________________   FINAL CLINICAL IMPRESSION Well exam   ED Discharge Orders     None        Note:  This document was prepared using Dragon voice recognition software and may include unintentional dictation errors.

## 2022-06-26 NOTE — Progress Notes (Signed)
BP was 90 when Jack Marshall here for EKG  States he walked [facility] [place] [facility] to [facility] - HR range 116 - 120  AMD

## 2022-07-09 DIAGNOSIS — H524 Presbyopia: Secondary | ICD-10-CM | POA: Diagnosis not present

## 2022-07-09 DIAGNOSIS — E119 Type 2 diabetes mellitus without complications: Secondary | ICD-10-CM | POA: Diagnosis not present

## 2022-07-23 ENCOUNTER — Ambulatory Visit (INDEPENDENT_AMBULATORY_CARE_PROVIDER_SITE_OTHER): Payer: 59 | Admitting: Urology

## 2022-07-23 ENCOUNTER — Encounter: Payer: Self-pay | Admitting: Urology

## 2022-07-23 VITALS — BP 123/84 | HR 89 | Ht 71.0 in | Wt 171.6 lb

## 2022-07-23 DIAGNOSIS — Z8042 Family history of malignant neoplasm of prostate: Secondary | ICD-10-CM

## 2022-07-23 DIAGNOSIS — N529 Male erectile dysfunction, unspecified: Secondary | ICD-10-CM

## 2022-07-23 DIAGNOSIS — R350 Frequency of micturition: Secondary | ICD-10-CM

## 2022-07-23 DIAGNOSIS — R972 Elevated prostate specific antigen [PSA]: Secondary | ICD-10-CM

## 2022-07-23 DIAGNOSIS — R399 Unspecified symptoms and signs involving the genitourinary system: Secondary | ICD-10-CM

## 2022-07-23 MED ORDER — TADALAFIL 5 MG PO TABS: 5.0000 mg | ORAL_TABLET | Freq: Every day | ORAL | 11 refills | Status: DC | PRN

## 2022-07-23 NOTE — Progress Notes (Signed)
07/23/22 11:27 AM   Jack Marshall April 25, 1960 [id number]  CC: Elevated PSA, urinary symptoms, ED  HPI: 62 year old male referred for mildly elevated PSA at 4.3 from May 2024 which had increased slightly from 3.7 in 2023, and 3.3 in 2022.  He does have a family history of prostate cancer in his father that was reportedly treated with surgery.  He also has some mild urinary symptoms of urgency and frequency, but drinks diet soda during the day.  He also reports problems with erections, he thinks he tried Viagra previously but does not remember how effective that was.  He denies any gross hematuria or dysuria, no cross-sectional imaging to review   PMH: Past Medical History:  Diagnosis Date   Colon polyp    2009, most recent in 2020 no polyps   Decreased vision of right eye    due to an accident    Diabetes mellitus without complication (HCC)    type 2   Family history of colon cancer    Hyperlipidemia    Lack of exercise    Overweight    Seasonal allergies    Sleep apnea    does not wear cpap   Testicular mass    right    Ventral hernia     Surgical History: Past Surgical History:  Procedure Laterality Date   COLONOSCOPY     normal colon   COLONOSCOPY W/ POLYPECTOMY  12/09/2005   9 mm TA in ascending colon   COLONOSCOPY WITH PROPOFOL N/A 09/23/2018   Procedure: COLONOSCOPY WITH PROPOFOL;  Surgeon: Toledo, Boykin Nearing, MD;  Location: [facility] ENDOSCOPY;  Service: Gastroenterology;  Laterality: N/A;   EYE SURGERY     TESTICULAR EXPLORATION      Family History: Family History  Problem Relation Age of Onset   Diabetes Mother    Colon cancer Father    Prostate cancer Father    Colon cancer Brother     Social History:  reports that he has never smoked. He has never used smokeless tobacco. He reports current alcohol use. He reports that he does not currently use drugs.  Physical Exam: BP 123/84 (BP Location: Left Arm, Patient Position: Sitting, Cuff Size: Normal)    Pulse 89   Ht 5\' 11"  (1.803 m)   Wt 171 lb 9.6 oz (77.8 kg)   BMI 23.93 kg/m    Constitutional:  Alert and oriented, No acute distress. Cardiovascular: No clubbing, cyanosis, or edema. Respiratory: Normal respiratory effort, no increased work of breathing. GI: Abdomen is soft, nontender, nondistended, no abdominal masses   Laboratory Data: PSA history reviewed, see HPI  Pertinent Imaging: None to review  Assessment & Plan:   62 year old male with family history of prostate cancer who presents with a mildly elevated PSA of 4.3, he also has mild urinary symptoms of urgency and frequency likely secondary to high intake of diet sodas, and ED interested in trial of Cialis.  We reviewed the implications of an elevated PSA and the uncertainty surrounding it. In general, a man's PSA increases with age and is produced by both normal and cancerous prostate tissue. The differential diagnosis for elevated PSA includes BPH, prostate cancer, infection, recent intercourse/ejaculation, recent urethroscopic manipulation (foley placement/cystoscopy) or trauma, and prostatitis. Management of an elevated PSA can include observation or prostate biopsy and we discussed this in detail. Our goal is to detect clinically significant prostate cancers, and manage with either active surveillance, surgery, or radiation for localized disease. Risks of prostate biopsy include bleeding,  infection (including life threatening sepsis), pain, and lower urinary symptoms. Hematuria, hematospermia, and blood in the stool are all common after biopsy and can persist up to 4 weeks.   Trial of Cialis 5 to 20 mg on demand for ED Discussed avoiding bladder irritants regarding mild urinary symptoms Repeat PSA with reflex to free, if remains elevated he prefers prostate MRI   Legrand Rams, MD 07/23/2022  [facility] [address] [place], [place] [address] [phone]

## 2022-07-23 NOTE — Patient Instructions (Signed)

## 2022-08-05 ENCOUNTER — Other Ambulatory Visit
Admission: RE | Admit: 2022-08-05 | Discharge: 2022-08-05 | Disposition: A | Discharge status: Home / Self Care | Payer: 59 | Attending: Urology | Admitting: Urology

## 2022-08-05 DIAGNOSIS — R972 Elevated prostate specific antigen [PSA]: Secondary | ICD-10-CM | POA: Insufficient documentation

## 2022-08-06 ENCOUNTER — Telehealth: Payer: Self-pay

## 2022-08-06 DIAGNOSIS — R972 Elevated prostate specific antigen [PSA]: Secondary | ICD-10-CM

## 2022-08-06 LAB — PSA, TOTAL AND FREE
PSA, Free Pct: 14.7 %
PSA, Free: 0.72 ng/mL
Prostate Specific Ag, Serum: 4.9 ng/mL — ABNORMAL HIGH (ref 0.0–4.0)

## 2022-08-06 NOTE — Telephone Encounter (Signed)
Called pt, no answer. Left detailed message per DPR. MRI ordered.

## 2022-08-06 NOTE — Telephone Encounter (Signed)
-----   Message from Sondra Come, MD sent at 08/06/2022  9:05 AM EDT ----- PSA remains elevated at 4.9, would recommend prostate MRI as discussed in clinic  Legrand Rams, MD 08/06/2022

## 2022-08-11 ENCOUNTER — Ambulatory Visit: Payer: 59

## 2022-08-18 ENCOUNTER — Ambulatory Visit
Admission: RE | Admit: 2022-08-18 | Discharge: 2022-08-18 | Disposition: A | Discharge status: Home / Self Care | Payer: 59 | Source: Ambulatory Visit | Attending: Urology | Admitting: Urology

## 2022-08-18 DIAGNOSIS — N4289 Other specified disorders of prostate: Secondary | ICD-10-CM | POA: Diagnosis not present

## 2022-08-18 DIAGNOSIS — R972 Elevated prostate specific antigen [PSA]: Secondary | ICD-10-CM | POA: Insufficient documentation

## 2022-08-18 MED ORDER — GADOBUTROL 1 MMOL/ML IV SOLN
7.0000 mL | Freq: Once | INTRAVENOUS | Status: AC | PRN
  Administered 2022-08-18: 7 mL via INTRAVENOUS

## 2022-08-21 ENCOUNTER — Telehealth: Payer: Self-pay

## 2022-08-21 NOTE — Telephone Encounter (Signed)
Called pt informed him of the information below, pt voiced understanding. While on the phone patient states that he is having testicle pain and soreness and requests appointment pt scheduled, states he has had testicle issues in the past. Prostate bx scheduled. Instructions sent.

## 2022-08-21 NOTE — Telephone Encounter (Signed)
-----   Message from Sondra Come, MD sent at 08/20/2022  8:39 AM EDT ----- Prostate MRI does show 1 abnormal area, and would recommend MRI fusion biopsy, please review biopsy instructions and schedule, thank you  Legrand Rams, MD 08/20/2022

## 2022-08-28 ENCOUNTER — Other Ambulatory Visit: Payer: Self-pay | Admitting: *Deleted

## 2022-08-28 ENCOUNTER — Ambulatory Visit (INDEPENDENT_AMBULATORY_CARE_PROVIDER_SITE_OTHER): Payer: 59 | Admitting: Urology

## 2022-08-28 ENCOUNTER — Encounter: Payer: Self-pay | Admitting: Urology

## 2022-08-28 ENCOUNTER — Other Ambulatory Visit
Admission: RE | Admit: 2022-08-28 | Discharge: 2022-08-28 | Disposition: A | Discharge status: Home / Self Care | Payer: 59 | Attending: Urology | Admitting: Urology

## 2022-08-28 VITALS — BP 120/83 | HR 111 | Ht 71.0 in | Wt 170.0 lb

## 2022-08-28 DIAGNOSIS — R399 Unspecified symptoms and signs involving the genitourinary system: Secondary | ICD-10-CM | POA: Diagnosis not present

## 2022-08-28 DIAGNOSIS — R972 Elevated prostate specific antigen [PSA]: Secondary | ICD-10-CM | POA: Diagnosis not present

## 2022-08-28 DIAGNOSIS — N5082 Scrotal pain: Secondary | ICD-10-CM | POA: Diagnosis not present

## 2022-08-28 LAB — URINALYSIS, COMPLETE (UACMP) WITH MICROSCOPIC
Bilirubin Urine: NEGATIVE
Glucose, UA: NEGATIVE mg/dL
Hgb urine dipstick: NEGATIVE
Ketones, ur: NEGATIVE mg/dL
Leukocytes,Ua: NEGATIVE
Nitrite: NEGATIVE
Protein, ur: NEGATIVE mg/dL
Specific Gravity, Urine: 1.02 (ref 1.005–1.030)
pH: 5.5 (ref 5.0–8.0)

## 2022-08-28 MED ORDER — CELECOXIB 200 MG PO CAPS: 200.0000 mg | ORAL_CAPSULE | Freq: Two times a day (BID) | ORAL | 0 refills | Status: DC

## 2022-08-28 NOTE — Progress Notes (Signed)
   08/28/2022 3:42 PM   Jack Marshall 05-Oct-1960 [id number]  Reason for visit: Follow up right scrotal pain, elevated PSA and abnormal prostate MRI, ED  HPI: 62 year old male who was found have an elevated PSA of 4.3 and on repeat remained elevated at 4.9(14.7% free).  He opted for a prostate MRI.  I personally viewed and interpreted the prostate MRI dated 08/18/2022 showing a 26g prostate with a PI-RADS 5 lesion in the right peripheral zone.  We discussed the need for prostate biopsy, and he has already been scheduled for 10/23/2022 for MRI fusion biopsy.  We again reviewed the risks and benefits of biopsy including bleeding, infection, possible need for additional procedures pending those results.  He also was prescribed Cialis for ED at our last visit, but he has not yet tried that medication.  He also reports some right-sided scrotal pain today.  Urinalysis is benign.  He denies any urinary symptoms.  He has tenderness with palpation in the right scrotum.  He reports a history of a right-sided scrotal surgery in the past, but none of those records are available to me, unclear what procedure he had performed.  On exam testicles are 20 cc and descended bilaterally without masses, there is mild tenderness of the right epididymis, no definite cysts or other lesions noted.  We discussed options including an ultrasound or starting with more conservative options like NSAIDs, snug fitting underwear, icing.  He would like to start with a course of Celebrex and behavioral strategies prior to considering an ultrasound.  Trial of Celebrex for right scrotal plan, consider scrotal ultrasound in the future if worsening symptoms Keep follow-up for MRI fusion prostate biopsy September 2024  Sondra Come, MD  [facility] [address] [place], [place] [address] [phone]

## 2022-08-28 NOTE — Patient Instructions (Signed)
 Prostate Biopsy Instructions ° °Stop all aspirin or blood thinners (aspirin, plavix, coumadin, warfarin, motrin, ibuprofen, advil, aleve, naproxen, naprosyn) for 7 days prior to the procedure.  If you have any questions about stopping these medications, please contact your primary care physician or cardiologist. ° °Having a light meal prior to the procedure is recommended.  If you are diabetic or have low blood sugar please bring a small snack or glucose tablet. ° °A Fleets enema is needed to be purchased over the counter at a local pharmacy and used 2 hours before you scheduled appointment.  This can be purchased over the counter at any pharmacy. ° °Antibiotics will be administered in the clinic at the time of the procedure unless otherwise specified.   ° °Please bring someone with you to the procedure to drive you home. ° °A follow up appointment has been scheduled for you to receive the results of the biopsy. ° °If you have any questions or concerns, please feel free to call the office at [phone] or send a Mychart message. ° ° ° °Thank you, °Staff at [facility]  °

## 2022-10-23 ENCOUNTER — Ambulatory Visit (INDEPENDENT_AMBULATORY_CARE_PROVIDER_SITE_OTHER): Payer: 59 | Admitting: Urology

## 2022-10-23 ENCOUNTER — Encounter: Payer: Self-pay | Admitting: Urology

## 2022-10-23 VITALS — BP 149/96 | HR 105 | Ht 71.0 in | Wt 175.0 lb

## 2022-10-23 DIAGNOSIS — C61 Malignant neoplasm of prostate: Secondary | ICD-10-CM

## 2022-10-23 DIAGNOSIS — R972 Elevated prostate specific antigen [PSA]: Secondary | ICD-10-CM

## 2022-10-23 DIAGNOSIS — N4231 Prostatic intraepithelial neoplasia: Secondary | ICD-10-CM | POA: Diagnosis not present

## 2022-10-23 DIAGNOSIS — N4232 Atypical small acinar proliferation of prostate: Secondary | ICD-10-CM

## 2022-10-23 DIAGNOSIS — Z2989 Encounter for other specified prophylactic measures: Secondary | ICD-10-CM | POA: Diagnosis not present

## 2022-10-23 MED ORDER — LEVOFLOXACIN 500 MG PO TABS
500.0000 mg | ORAL_TABLET | Freq: Once | ORAL | Status: AC
Start: 2022-10-23 — End: 2022-10-23
  Administered 2022-10-23: 500 mg via ORAL

## 2022-10-23 MED ORDER — GENTAMICIN SULFATE 40 MG/ML IJ SOLN
80.0000 mg | Freq: Once | INTRAMUSCULAR | Status: AC
Start: 2022-10-23 — End: 2022-10-23
  Administered 2022-10-23: 80 mg via INTRAMUSCULAR

## 2022-10-23 NOTE — Progress Notes (Signed)
   10/23/22  Indication: Elevated PSA, 4.9  MRI Fusion Prostate Biopsy Procedure   Informed consent was obtained, and we discussed the risks of bleeding and infection/sepsis. A time out was performed to ensure correct patient identity.  Pre-Procedure: - Last PSA Level: 4.9 - Gentamicin and levaquin given for antibiotic prophylaxis -Prostate measured 26 g on MRI, PSA density 0.19 - No significant hypoechoic or median lobe noted  Procedure: - Prostate block performed using 10 cc 1% lidocaine  - MRI fusion biopsy was performed, and 3 biopsies were taken from the ROI#1 PIRADS 5 lesion located right anterior and posterior lateral peripheral zone at base and mid gland - Standard biopsies taken from sextant areas, 12 under ultrasound guidance. - Total of 15 cores taken  Post-Procedure: - Patient tolerated the procedure well - He was counseled to seek immediate medical attention if experiences significant bleeding, fevers, or severe pain - Return in one week to discuss biopsy results  Assessment/ Plan: Will follow up in 1-2 weeks to discuss pathology with Dr. Leitha Schuller, MD 10/23/2022

## 2022-10-23 NOTE — Patient Instructions (Signed)

## 2022-11-05 ENCOUNTER — Encounter: Payer: Self-pay | Admitting: Urology

## 2022-11-05 ENCOUNTER — Ambulatory Visit: Payer: 59 | Admitting: Urology

## 2022-11-05 VITALS — BP 122/82 | HR 92 | Ht 71.0 in | Wt 170.0 lb

## 2022-11-05 DIAGNOSIS — C61 Malignant neoplasm of prostate: Secondary | ICD-10-CM

## 2022-11-05 NOTE — Progress Notes (Signed)
11/05/2022 8:49 AM   Jack Marshall 1961-01-30 [id number]  Reason for visit: Follow up prostate biopsy results, new diagnosis of prostate cancer  HPI: 62 year old male here with his wife who is Spanish-speaking today to review his prostate biopsy results.  He was referred for an elevated PSA of 4.9, opted for a prostate MRI which showed a PI-RADS 5 lesion at the right lateral base, and MRI fusion biopsy showed Gleason score 4+4=8 prostate cancer in the ROI.  Prostate volume was 26g.   He denies any prior abdominal surgeries.  He has some mild urinary urgency and frequency but primarily secondary to high consumption of diet soda.  He also has ED, was previously prescribed Cialis this summer but has not tried that medication yet.  We had a lengthy conversation today about the patient's new diagnosis of prostate cancer.  We reviewed the risk classifications per the AUA guidelines including very low risk, low risk, intermediate risk, and high risk disease, and the need for additional staging imaging with CT and bone scan in patients with unfavorable intermediate risk and high risk disease.  I explained that his life expectancy, clinical stage, Gleason score, PSA, and other co-morbidities influence treatment strategies.  We discussed the roles of active surveillance, radiation therapy, surgical therapy with robotic prostatectomy, and hormone therapy with androgen deprivation.  We discussed that patients urinary symptoms also impact treatment strategy, as patients with severe lower urinary tract symptoms may have significant worsening or even develop urinary retention after undergoing radiation.  In regards to surgery, we discussed robotic prostatectomy +/- lymphadenectomy at length.  The procedure takes 3 to 4 hours, and patient's typically discharge home on post-op day #1.  A Foley catheter is left in place for 7 to 10 days to allow for healing of the vesicourethral anastomosis.  There is a small  risk of bleeding, infection, damage to surrounding structures or bowel, hernia, DVT/PE, or serious cardiac or pulmonary complications.  We discussed at length post-op side effects including erectile dysfunction, and the importance of pre-operative erectile function on long-term outcomes.  Even with a nerve sparing approach, there is an approximately 25% rate of permanent erectile dysfunction.  We also discussed postop urinary incontinence at length.  We expect patients to have stress incontinence post-operatively that will improve over period of weeks to months.  Less than 10% of men will require a pad at 1 year after surgery.  Patients will need to avoid heavy lifting and strenuous activity for 3 to 4 weeks, but most men return to their baseline activity status by 6 weeks.  In summary, Jack Marshall is a 62 y.o. man with newly diagnosed high risk prostate cancer.  He is unsure how he would like to proceed.  Will order bone scan to complete staging, and referral placed to radiation oncology.  He will reach out to [place] with his decision on treatment strategy.  I spent 40 total minutes on the day of the encounter including pre-visit review of the medical record, face-to-face time with the patient, and post visit ordering of labs/imaging/tests.    Sondra Come, MD  [facility] [address] [place], [place] [address] [phone]

## 2022-11-05 NOTE — Patient Instructions (Addendum)
Your prostate biopsy showed a small amount of prostate cancer.  This is the type of cancer(Gleason score 4+4=8), that if untreated would likely spread outside the prostate and cause problems.  Either surgery or radiation are both reasonable options for treatment.  We will order a bone scan to make sure there is no spread outside the prostate, however your MRI showed no evidence of spread.  We will coordinate follow-up with radiation oncology to discuss radiation in more detail as well, and after you decide on a treatment strategy(surgery or radiation) reach out to [place] and we will help coordinate the next steps.   Prostate Cancer  The prostate is a small gland that produces fluid that makes up semen (seminal fluid). It is located below the bladder in men, in front of the rectum. Prostate cancer is the abnormal growth of cells in the prostate gland. What are the causes? The exact cause of this condition is not known. What increases the risk? You are more likely to develop this condition if: You are 66 years of age or older. You have a family history of prostate cancer. You have a family history of breast and ovarian cancer. You have genes that are passed from parent to child (inherited), such as BRCA1 and BRCA2. You have Lynch syndrome. African American men and men of African descent are diagnosed with prostate cancer at higher rates than other men. The reasons for this are not well understood and are likely due to a combination of genetic and environmental factors. What are the signs or symptoms? Symptoms of this condition include: Problems with urination. This may include: A weak or interrupted flow of urine. Trouble starting or stopping urination. Trouble emptying the bladder all the way. The need to urinate more often, especially at night. Blood in urine or semen. Persistent pain or discomfort in the lower back, lower abdomen, or hips. Trouble getting an erection. Weakness or numbness in  the legs or feet. How is this diagnosed? This condition can be diagnosed with: A digital rectal exam. For this exam, a health care provider inserts a gloved finger into the rectum to feel the prostate gland. A blood test called a prostate-specific antigen (PSA) test. A procedure in which a sample of tissue is taken from the prostate and checked under a microscope (prostate biopsy). An imaging test called transrectal ultrasonography. Once the condition is diagnosed, tests will be done to determine how far the cancer has spread. This is called staging the cancer. Staging may involve imaging tests, such as a bone scan, CT scan, PET scan, or MRI. Stages of prostate cancer The stages of prostate cancer are as follows: Stage 1 (I). At this stage, the cancer is found in the prostate only. The cancer is not visible on imaging tests, and it is usually found by accident, such as during prostate surgery. Stage 2 (II). At this stage, the cancer is more advanced than it is in stage 1, but the cancer has not spread outside the prostate. Stage 3 (III). At this stage, the cancer has spread beyond the outer layer of the prostate to nearby tissues. The cancer may be found in the seminal vesicles, which are near the bladder and the prostate. Stage 4 (IV). At this stage, the cancer has spread to other parts of the body, such as the lymph nodes, bones, bladder, rectum, liver, or lungs. Prostate cancer grading Prostate cancer is also graded according to how the cancer cells look under a microscope. This is called  the Gleason score and the total score can range from 6-10, indicating how likely it is that the cancer will spread (metastasize) to other parts of the body. The higher the score, the greater the likelihood that the cancer will spread. Gleason 6 or lower: This indicates that the cancer cells look similar to normal prostate cells (well differentiated). Gleason 7: This indicates that the cancer cells look somewhat  similar to normal prostate cells (moderately differentiated). Gleason 8, 9, or 10: This indicates that the cancer cells look very different than normal prostate cells (poorly differentiated). How is this treated? Treatment for this condition depends on several factors, including the stage of the cancer, your age, personal preferences, and your overall health. Talk with your health care provider about treatment options that are recommended for you. Common treatments include: Observation for early stage prostate cancer (active surveillance). This involves having exams, blood tests, and in some cases, more biopsies. For some men, this is the only treatment needed. Surgery. Types of surgeries include: Open surgery (radical prostatectomy). In this surgery, a larger incision is made to remove the prostate. A laparoscopic radical prostatectomy. This is a surgery to remove the prostate and lymph nodes through several small incisions. It is often referred to as a minimally invasive surgery. A robotic radical prostatectomy. This is laparoscopic surgery to remove the prostate and lymph nodes with the help of robotic arms that are controlled by the surgeon. Cryoablation. This is surgery to freeze and destroy cancer cells. Radiation treatment. Types of radiation treatment include: External beam radiation. This type aims beams of radiation from outside the body at the prostate to destroy cancerous cells. Brachytherapy. This type uses radioactive needles, seeds, wires, or tubes that are implanted into the prostate gland. Like external beam radiation, brachytherapy destroys cancerous cells. An advantage is that this type of radiation limits the damage to surrounding tissue and has fewer side effects. Chemotherapy. This treatment kills cancer cells or stops them from multiplying. It kills both cancer cells and normal cells. Targeted therapy. This treatment uses medicines to kill cancer cells without damaging normal  cells. Hormone treatment. This treatment involves taking medicines that act on testosterone, one of the male hormones, by: Stopping your body from producing testosterone. Blocking testosterone from reaching cancer cells. Follow these instructions at home: Lifestyle Do not use any products that contain nicotine or tobacco. These products include cigarettes, chewing tobacco, and vaping devices, such as e-cigarettes. If you need help quitting, ask your health care provider. Eat a healthy diet. To do this: Eat foods that are high in fiber. These include beans, whole grains, and fresh fruits and vegetables. Limit foods that are high in fat and sugar. These include fried or sweet foods. Treatment for prostate cancer may affect sexual function. If you have a partner, continue to have intimate moments. This may include touching, holding, hugging, and caressing your partner. Get plenty of sleep. Consider joining a support group for men who have prostate cancer. Meeting with a support group may help you learn to manage the stress of having cancer. General instructions Take over-the-counter and prescription medicines only as told by your health care provider. If you have to go to the hospital, notify your cancer specialist (oncologist). Keep all follow-up visits. This is important. Where to find more information American Cancer Society: www.cancer.org American Society of Clinical Oncology: www.cancer.net [employer]: www.cancer.gov Contact a health care provider if: You have new or increasing trouble urinating. You have new or increasing blood in  your urine. You have new or increasing pain in your hips, back, or chest. Get help right away if: You have weakness or numbness in your legs. You cannot control urination or your bowel movements (incontinence). You have chills or a fever. Summary The prostate is a small gland that is involved in the production of semen. It is located below a  man's bladder, in front of the rectum. Prostate cancer is the abnormal growth of cells in the prostate gland. Treatment for this condition depends on the stage of the cancer, your age, personal preferences, and your overall health. Talk with your health care provider about treatment options that are recommended for you. Consider joining a support group for men who have prostate cancer. Meeting with a support group may help you learn to manage the stress of having cancer. This information is not intended to replace advice given to you by your health care provider. Make sure you discuss any questions you have with your health care provider. Document Revised: 04/26/2020 Document Reviewed: 04/26/2020 Elsevier Patient Education  2024 [employer]  Robot-Assisted Laparoscopic Radical Prostatectomy  Robot-assisted laparoscopic radical prostatectomy is surgery done to remove the entire prostate and nearby tissue. This includes the seminal vesicles, which are near the bladder and the prostate. This procedure is done to treat prostate cancer that has not spread (metastasized) to other parts of the body. The goal of the surgery is to remove all cancer cells to help keep the cancer from metastasizing. During this procedure, the surgeon makes several incisions in the abdomen instead of one large incision. A long, thin, lighted tube with a tiny camera on the end (laparoscope) is put into one of the incisions. This allows the surgeon to see inside the abdomen. Other surgical tools are put in through the other incisions and used to take out the prostate and nearby tissues. The surgeon uses robotic arms to control these tools while sitting at a computer near the operating table. Lymph nodes in the pelvis may also be removed. Lymph nodes are part of the body's disease-fighting system (immune system). When prostate cancer spreads, it tends to go to the lymph nodes in the pelvis first. If the pelvic lymph nodes are  removed, they will be checked for cancer cells. Tell a health care provider about: Any allergies you have. All medicines you are taking, including vitamins, herbs, eye drops, creams, and over-the-counter medicines. Any problems you or family members have had with anesthetic medicines. Any bleeding problems you have. Any surgeries you have had. Any medical conditions you have. Any prostate infections you have had. What are the risks? Generally, this is a safe procedure. Still, problems may occur, including: Infection. Bleeding. Allergic reactions to medicines. Damage to nearby structures or organs, such as the rectum, ureters, urethra, bladder, or small intestine. Blockage (obstruction) of the large or small intestines. Problems that affect urination or sexual function. These may include: Narrowing or scarring of the urethra (stricture), which may block the flow of urine. Inability to control when you urinate (incontinence). Inability to get or keep an erection (erectile dysfunction). Dry ejaculation. This is when no semen comes out during orgasm. The formation of a sac (cyst) in the pelvis that is filled with fluid from the lymph glands (lymphocele). Blood clots in the legs. What happens before the procedure? Staying hydrated Follow instructions from your health care provider about hydration, which may include: Up to 2 hours before the procedure - you may continue to drink clear liquids, such  as water, clear fruit juice, black coffee, and plain tea.  Eating and drinking restrictions Follow instructions from your health care provider about eating and drinking, which may include: 8 hours before the procedure - stop eating heavy meals or foods, such as meat, fried foods, or fatty foods. 6 hours before the procedure - stop eating light meals or foods, such as toast or cereal. 6 hours before the procedure - stop drinking milk or drinks that contain milk. 2 hours before the procedure -  stop drinking clear liquids. Medicines Ask your health care provider about: Changing or stopping your regular medicines. This is especially important if you are taking diabetes medicines or blood thinners. Taking medicines such as aspirin and ibuprofen. These medicines can thin your blood. Do not take these medicines unless your health care provider tells you to take them. Taking over-the-counter medicines, vitamins, herbs, and supplements. Follow your health care provider's instructions about cleaning out your bowels. Surgery safety Ask your health care provider: How your surgery site will be marked. What steps will be taken to help prevent infection. These steps may include: Removing hair at the surgery site. Washing skin with a germ-killing soap. Taking antibiotic medicine. General instructions Do not use any products that contain nicotine or tobacco for at least 4 weeks before the procedure. These products include cigarettes, chewing tobacco, and vaping devices, such as e-cigarettes. If you need help quitting, ask your health care provider. Plan to have a responsible adult take you home from the hospital or clinic. Plan to have a responsible adult care for you for the time you are told after you leave the hospital or clinic. You may have an exam or testing. This may include blood or urine samples, or imaging tests such as a CT scan or an MRI. What happens during the procedure? An IV will be put into a vein in your hand or arm. You may be given: A medicine to help you relax (sedative). A medicine to make you fall asleep (general anesthetic). A thin, flexible tube (Foley catheter) will be put into your penis through your urethra and into your bladder to drain your urine. Small incisions will be made in your abdomen and near your belly button. The laparoscope and other surgical instruments will be put through the incisions. The surgical tools will be used to cut and remove your prostate,  seminal vesicles, and maybe your pelvic lymph nodes. Your surgeon will use a computer and robotic arms to control the surgical instruments. Your urethra will be cut and separated from your bladder to take out the prostate. Your urethra will then be reconnected to your bladder neck. This is the group of muscles that help push urine through your urethra. A small tube (drain) may be put in one or more of your incisions to help drain extra fluid from your surgical site after surgery. The laparoscope and other surgical instruments will be removed. Your incisions will be closed with stitches (sutures), skin glue, or adhesive strips. Medicine may be applied and bandages (dressings) will be placed over your incisions. The procedure may vary among health care providers and hospitals. What happens after the procedure? Your blood pressure, heart rate, breathing rate, and blood oxygen level will be monitored until you leave the hospital or clinic. You may get fluids and medicines through your IV. You may be given antibiotics and medicines to help relieve pain or nausea. You will be encouraged to walk as soon as possible. You will also use a  device or do breathing exercises to keep your lungs clear. The catheter will stay in to drain urine from your bladder. You will be taught how to care for it at home. The drain may stay in to drain fluid from the surgical site. If so, you will be taught how to care for it at home. You may need to wear compression stockings until you are able to get up and walk around. These stockings help prevent blood clots and reduce swelling in your legs. If you were given a sedative during the procedure, it can affect you for several hours. Do not drive or operate machinery until your health care provider says that it is safe. Summary Robot-assisted laparoscopic radical prostatectomy is a surgical procedure to remove the entire prostate and the seminal vesicles. Follow instructions from  your health care provider about eating and drinking before your surgery. After your procedure, you may be given fluids and medicines through an IV. You may get antibiotics and medicines to help relieve pain or nausea. After your surgery, you will continue to have a small, thin tube (Foley catheter) draining your urine. You will be taught how to care for it at home. This information is not intended to replace advice given to you by your health care provider. Make sure you discuss any questions you have with your health care provider. Document Revised: 04/26/2020 Document Reviewed: 04/26/2020 Elsevier Patient Education  2024 [employer].

## 2022-11-12 ENCOUNTER — Encounter
Admission: RE | Admit: 2022-11-12 | Discharge: 2022-11-12 | Disposition: A | Discharge status: Home / Self Care | Payer: 59 | Source: Ambulatory Visit | Attending: Urology | Admitting: Urology

## 2022-11-12 DIAGNOSIS — M19071 Primary osteoarthritis, right ankle and foot: Secondary | ICD-10-CM | POA: Diagnosis not present

## 2022-11-12 DIAGNOSIS — C61 Malignant neoplasm of prostate: Secondary | ICD-10-CM | POA: Insufficient documentation

## 2022-11-12 DIAGNOSIS — M19072 Primary osteoarthritis, left ankle and foot: Secondary | ICD-10-CM | POA: Diagnosis not present

## 2022-11-12 MED ORDER — TECHNETIUM TC 99M MEDRONATE IV KIT
20.0000 | PACK | Freq: Once | INTRAVENOUS | Status: AC | PRN
  Administered 2022-11-12: 21.82 via INTRAVENOUS

## 2022-11-13 ENCOUNTER — Ambulatory Visit: Payer: Self-pay | Admitting: Physician Assistant

## 2022-11-13 ENCOUNTER — Other Ambulatory Visit: Payer: Self-pay | Admitting: Physician Assistant

## 2022-11-13 ENCOUNTER — Encounter: Payer: Self-pay | Admitting: Physician Assistant

## 2022-11-13 VITALS — BP 116/86 | HR 102 | Temp 97.1°F | Resp 12 | Ht 71.0 in | Wt 169.0 lb

## 2022-11-13 DIAGNOSIS — G4730 Sleep apnea, unspecified: Secondary | ICD-10-CM

## 2022-11-13 MED ORDER — FREESTYLE LIBRE 3 PLUS SENSOR MISC: 6 refills | Status: DC

## 2022-11-13 NOTE — Progress Notes (Signed)
Subjective: Sleep apnea    Patient ID: Jack Marshall, male    DOB: 25-May-1960, 62 y.o.   MRN: [id number]  HPI Patient request consult for sleep apnea.  Patient has a history of OSA but has not used his CPAP machine in over a decade.  Patient states he is now retired from the [employer] and is amenable to using the machine on continuous basis.   Review of Systems Diabetes, prostate cancer, and hyperlipidemia.    Objective:   Physical Exam BP 116/86  BP Location Left Arm  Patient Position Sitting  Cuff Size Large  Pulse 102  Resp 12  Temp 97.1 F (36.2 C)  Temp src Temporal  SpO2 96 %  Weight 169 lb (76.7 kg)  Height 5\' 11"  (1.803 m)   BMI 23.57 kg/m2  BSA 1.96 m2  Physical exam was deferred.       Assessment & Plan: Sleep apnea  Consult for sleep study.

## 2022-11-13 NOTE — Addendum Note (Signed)
Addended by: Gardner Candle on: 11/13/2022 11:27 AM   Modules accepted: Orders

## 2022-11-13 NOTE — Progress Notes (Addendum)
Requesting referral for sleep study.  States had a sleep study about 15 years ago at [facility] & Dx'd with Apnea.  Doesn't currently use a CPAP - states his quit working.  Tried to get parts for it & wasn't able to get them.  Hasn't used it for several years.  2.  Under care of Urologist - Prostate Cancer - Has had an  MRI, Biopsy & Bone Scan.  11/25/22 scheduled to see a doctor who is supposed to see Dr. Aggie Cosier for interpretation of Bone Scan & determine next steps.  AMD  Sleep study referral faxed to [facility]. Fax #:  [phone]  AMD

## 2022-11-18 ENCOUNTER — Other Ambulatory Visit: Payer: Self-pay | Admitting: Urology

## 2022-11-18 DIAGNOSIS — C61 Malignant neoplasm of prostate: Secondary | ICD-10-CM

## 2022-11-19 ENCOUNTER — Other Ambulatory Visit: Payer: Self-pay

## 2022-11-19 ENCOUNTER — Other Ambulatory Visit: Payer: Self-pay | Admitting: Physician Assistant

## 2022-11-19 DIAGNOSIS — E119 Type 2 diabetes mellitus without complications: Secondary | ICD-10-CM

## 2022-11-19 DIAGNOSIS — C61 Malignant neoplasm of prostate: Secondary | ICD-10-CM

## 2022-11-19 NOTE — Progress Notes (Unsigned)
Surgical Physician Order Form [facility]  Dr. Legrand Rams, MD  * Scheduling expectation : Next Available  *Length of Case: 3 hours  *Clearance needed: no  *Anticoagulation Instructions: Hold all anticoagulants  *Aspirin Instructions: Hold Aspirin  *Post-op visit Date/Instructions:  1 week cath removal  *Diagnosis: Prostate Cancer  *Procedure:  Robotic laparoscopic Prostatectomy (57846) and bilateral lymph node dissection   Additional orders: N/A  -Admit type: Observation  -Anesthesia: General  -VTE Prophylaxis Standing Order SCD's       Other:   -Standing Lab Orders Per Anesthesia    Lab other: CBC, BMP, type and screen  -Standing Test orders EKG/Chest x-ray per Anesthesia       Test other:   - Medications:  Ancef 2gm IV  -Other orders:  N/A

## 2022-11-20 ENCOUNTER — Telehealth: Payer: Self-pay

## 2022-11-20 NOTE — Progress Notes (Signed)
   [facility] Surgical Posting Form  Surgery Date: Date: 01/13/2023  Surgeon: Dr. Legrand Rams, MD  Inpt ( No  )   Outpt (No)   Obs ( Yes  )   Diagnosis: C61 Prostate Cancer  -CPT: [phone], [phone]  Surgery: Robotic Laparoscopic Radical Prostatectomy with Bilateral Pelvic Lymph Node Dissection   Stop Anticoagulations: Yes and also hold ASA  Cardiac/Medical/Pulmonary Clearance needed: no  *Orders entered into EPIC  Date: 11/20/22   *Case booked in EPIC  Date: 11/20/22  *Notified pt of Surgery: Date: 11/20/22  PRE-OP UA & CX: Yes and also obtain CBC, BMP, INR, TYPE AND SCREEN  *Placed into Prior Authorization Work Que Date: 11/20/22  Assistant/laser/rep: Yes, Dr. Apolinar Junes to help assist.

## 2022-11-20 NOTE — Telephone Encounter (Signed)
Per Dr. Richardo Hanks, Patient is to be scheduled for Robotic Laparoscopic Radical Prostatectomy with Bilateral Pelvic Lymph Node Dissection   Mr. [employer] was contacted and possible surgical dates were discussed, Monday December 2nd, 2024 was agreed upon for surgery.   Patient was directed to call [phone] between 1-3pm the day before surgery to find out surgical arrival time.  Instructions were given not to eat or drink from midnight on the night before surgery and have a driver for the day of surgery. On the surgery day patient was instructed to enter through the [facility] entrance of [facility] report the Same Day Surgery desk.   Pre-Admit Testing will be in contact via phone to set up an interview with the anesthesia team to review your history and medications prior to surgery.   Reminder of this information was sent via MyChart to the patient.

## 2022-11-25 ENCOUNTER — Encounter: Payer: Self-pay | Admitting: Radiation Oncology

## 2022-11-25 ENCOUNTER — Ambulatory Visit
Admission: RE | Admit: 2022-11-25 | Discharge: 2022-11-25 | Disposition: A | Discharge status: Home / Self Care | Payer: 59 | Source: Ambulatory Visit | Attending: Radiation Oncology | Admitting: Radiation Oncology

## 2022-11-25 VITALS — BP 133/84 | HR 106 | Temp 97.0°F | Resp 14 | Ht 71.0 in | Wt 168.2 lb

## 2022-11-25 DIAGNOSIS — R351 Nocturia: Secondary | ICD-10-CM | POA: Diagnosis not present

## 2022-11-25 DIAGNOSIS — E119 Type 2 diabetes mellitus without complications: Secondary | ICD-10-CM | POA: Diagnosis not present

## 2022-11-25 DIAGNOSIS — Z791 Long term (current) use of non-steroidal anti-inflammatories (NSAID): Secondary | ICD-10-CM | POA: Diagnosis not present

## 2022-11-25 DIAGNOSIS — Z800 Family history of malignant neoplasm of digestive organs: Secondary | ICD-10-CM | POA: Insufficient documentation

## 2022-11-25 DIAGNOSIS — Z860101 Personal history of adenomatous and serrated colon polyps: Secondary | ICD-10-CM | POA: Insufficient documentation

## 2022-11-25 DIAGNOSIS — K439 Ventral hernia without obstruction or gangrene: Secondary | ICD-10-CM | POA: Diagnosis not present

## 2022-11-25 DIAGNOSIS — E785 Hyperlipidemia, unspecified: Secondary | ICD-10-CM | POA: Diagnosis not present

## 2022-11-25 DIAGNOSIS — Z79899 Other long term (current) drug therapy: Secondary | ICD-10-CM | POA: Insufficient documentation

## 2022-11-25 DIAGNOSIS — Z7984 Long term (current) use of oral hypoglycemic drugs: Secondary | ICD-10-CM | POA: Insufficient documentation

## 2022-11-25 DIAGNOSIS — G4730 Sleep apnea, unspecified: Secondary | ICD-10-CM | POA: Insufficient documentation

## 2022-11-25 DIAGNOSIS — Z8042 Family history of malignant neoplasm of prostate: Secondary | ICD-10-CM | POA: Insufficient documentation

## 2022-11-25 DIAGNOSIS — C61 Malignant neoplasm of prostate: Secondary | ICD-10-CM

## 2022-11-25 NOTE — Progress Notes (Signed)
NEW PATIENT EVALUATION  Name: Jack Marshall  MRN: [id number]  Date:   11/25/2022     DOB: 1961-01-20   This 62 y.o. 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  REFERRING PHYSICIAN: Sondra Come, MD  CHIEF COMPLAINT: No chief complaint on file.   DIAGNOSIS: Prostate cancer   PREVIOUS INVESTIGATIONS:  MRI scan bone scan reviewed Clinical notes reviewed Pathology reports reviewed  HPI: Patient is a 62 year old male who presents with an elevated PSA of 4.9.  MRI of his prostate was performedShowing a PI-RADS category 5 lesion in the right peripheral zone.  There was no evidence of trans capsular spread seminal vesicle involvement pelvic adenopathy or bone metastasis.  He had bone scan also showing on my review no evidence of bone metastasis.  He is fairly asymptomatic does have nocturia x 3.  He is having no GI problems and no bone pain.  He is now referred to radiation oncology for consideration of treatment.  PLANNED TREATMENT REGIMEN: Robotic prostatectomy  PAST MEDICAL HISTORY:  has a past medical history of Colon polyp, Decreased vision of right eye, Diabetes mellitus without complication (HCC), Family history of colon cancer, Hyperlipidemia, Lack of exercise, Overweight, Seasonal allergies, Sleep apnea, Testicular mass, and Ventral hernia.    PAST SURGICAL HISTORY:  Past Surgical History:  Procedure Laterality Date   COLONOSCOPY     normal colon   COLONOSCOPY W/ POLYPECTOMY  12/09/2005   9 mm TA in ascending colon   COLONOSCOPY WITH PROPOFOL N/A 09/23/2018   Procedure: COLONOSCOPY WITH PROPOFOL;  Surgeon: Toledo, Boykin Nearing, MD;  Location: [facility] ENDOSCOPY;  Service: Gastroenterology;  Laterality: N/A;   EYE SURGERY     TESTICULAR EXPLORATION      FAMILY HISTORY: family history includes Colon cancer in his brother and father; Diabetes in his  mother; Prostate cancer in his father.  SOCIAL HISTORY:  reports that he has never smoked. He has never been exposed to tobacco smoke. He has never used smokeless tobacco. He reports current alcohol use. He reports that he does not currently use drugs.  ALLERGIES: Patient has no known allergies.  MEDICATIONS:  Current Outpatient Medications  Medication Sig Dispense Refill   atorvastatin (LIPITOR) 10 MG tablet Take 1 tablet (10 mg total) by mouth daily. 90 tablet 3   celecoxib (CELEBREX) 200 MG capsule Take 1 capsule (200 mg total) by mouth 2 (two) times daily. (Patient not taking: Reported on 11/13/2022) 14 capsule 0   Continuous Glucose Sensor (FREESTYLE LIBRE 3 PLUS SENSOR) MISC Change sensor every 15 days. 2 each 6   metFORMIN (GLUCOPHAGE) 1000 MG tablet Take 1 tablet (1,000 mg total) by mouth 2 (two) times daily with a meal. 180 tablet 3   Multiple Vitamin (MULTIVITAMIN) capsule Take 1 capsule by mouth daily.     tadalafil (CIALIS) 5 MG tablet Take 1-4 tablets (5-20 mg total) by mouth daily as needed for erectile dysfunction (take 45 minutes prior to sexual activity). 30 tablet 11   No current facility-administered medications for this encounter.    ECOG PERFORMANCE STATUS:  0 - Asymptomatic  REVIEW OF SYSTEMS: Patient denies any weight loss, fatigue, weakness, fever, chills or night sweats. Patient denies any loss of vision, blurred vision. Patient denies any ringing  of the ears or hearing loss. No irregular heartbeat. Patient denies heart murmur or history of fainting. Patient denies  any chest pain or pain radiating to her upper extremities. Patient denies any shortness of breath, difficulty breathing at night, cough or hemoptysis. Patient denies any swelling in the lower legs. Patient denies any nausea vomiting, vomiting of blood, or coffee ground material in the vomitus. Patient denies any stomach pain. Patient states has had normal bowel movements no significant constipation or  diarrhea. Patient denies any dysuria, hematuria or significant nocturia. Patient denies any problems walking, swelling in the joints or loss of balance. Patient denies any skin changes, loss of hair or loss of weight. Patient denies any excessive worrying or anxiety or significant depression. Patient denies any problems with insomnia. Patient denies excessive thirst, polyuria, polydipsia. Patient denies any swollen glands, patient denies easy bruising or easy bleeding. Patient denies any recent infections, allergies or URI. Patient "s visual fields have not changed significantly in recent time.   PHYSICAL EXAM: BP 133/84   Pulse (!) 106   Temp (!) 97 F (36.1 C)   Resp 14   Ht 5\' 11"  (1.803 m)   Wt 168 lb 3.2 oz (76.3 kg)   BMI 23.46 kg/m  Well-developed well-nourished patient in NAD. HEENT reveals PERLA, EOMI, discs not visualized.  Oral cavity is clear. No oral mucosal lesions are identified. Neck is clear without evidence of cervical or supraclavicular adenopathy. Lungs are clear to A&P. Cardiac examination is essentially unremarkable with regular rate and rhythm without murmur rub or thrill. Abdomen is benign with no organomegaly or masses noted. Motor sensory and DTR levels are equal and symmetric in the upper and lower extremities. Cranial nerves II through XII are grossly intact. Proprioception is intact. No peripheral adenopathy or edema is identified. No motor or sensory levels are noted. Crude visual fields are within normal range.  LABORATORY DATA: Pathology reports reviewed    RADIOLOGY RESULTS: Bone scan and MRI scan reviewed compatible with above-stated findings   IMPRESSION: Stage IIc Gleason 8 (4+4) adenocarcinoma the prostate in 62 year old male  PLAN: At this time of gone over treatment options including both external beam treatment as well as I-125 interstitial implant.  Based on the patient's young age and overall excellent general health I have recommended r robotic  assisted prostatectomy.  The main benefit would be salvage radiation should he have a biochemical failure after prostatectomy of which the converse is not true if he fails radiation therapy.  I believe the patient is not adverse to surgery and I am referring him back to Dr. Signa Kell to have that planned.  Patient and wife both comprehend my recommendations well.  I would like to take this opportunity to thank you for allowing me to participate in the care of your patient.Carmina Miller, MD

## 2022-12-02 ENCOUNTER — Ambulatory Visit (INDEPENDENT_AMBULATORY_CARE_PROVIDER_SITE_OTHER): Payer: 59 | Admitting: Internal Medicine

## 2022-12-02 VITALS — BP 114/71 | HR 93 | Resp 12 | Ht 71.0 in | Wt 166.0 lb

## 2022-12-02 DIAGNOSIS — G4733 Obstructive sleep apnea (adult) (pediatric): Secondary | ICD-10-CM | POA: Insufficient documentation

## 2022-12-02 DIAGNOSIS — F513 Sleepwalking [somnambulism]: Secondary | ICD-10-CM | POA: Diagnosis not present

## 2022-12-02 NOTE — Patient Instructions (Signed)
Living With Sleep Apnea Sleep apnea is a condition in which breathing pauses or becomes shallow during sleep. Sleep apnea is most commonly caused by a collapsed or blocked airway. People with sleep apnea usually snore loudly. They may have times when they gasp and stop breathing for 10 seconds or more during sleep. This may happen many times during the night. The breaks in breathing also interrupt the deep sleep that you need to feel rested. Even if you do not completely wake up from the gaps in breathing, your sleep may not be restful and you feel tired during the day. You may also have a headache in the morning and low energy during the day, and you may feel anxious or depressed. How can sleep apnea affect me? Sleep apnea increases your chances of extreme tiredness during the day (daytime fatigue). It can also increase your risk for health conditions, such as: Heart attack. Stroke. Obesity. Type 2 diabetes. Heart failure. Irregular heartbeat. High blood pressure. If you have daytime fatigue as a result of sleep apnea, you may be more likely to: Perform poorly at school or work. Fall asleep while driving. Have difficulty with attention. Develop depression or anxiety. Have sexual dysfunction. What actions can I take to manage sleep apnea? Sleep apnea treatment  If you were given a device to open your airway while you sleep, use it only as told by your health care provider. You may be given: An oral appliance. This is a custom-made mouthpiece that shifts your lower jaw forward. A continuous positive airway pressure (CPAP) device. This device blows air through a mask when you breathe out (exhale). A nasal expiratory positive airway pressure (EPAP) device. This device has valves that you put into each nostril. A bi-level positive airway pressure (BIPAP) device. This device blows air through a mask when you breathe in (inhale) and breathe out (exhale). You may need surgery if other treatments  do not work for you. Sleep habits Go to sleep and wake up at the same time every day. This helps set your internal clock (circadian rhythm) for sleeping. If you stay up later than usual, such as on weekends, try to get up in the morning within 2 hours of your normal wake time. Try to get at least 7-9 hours of sleep each night. Stop using a computer, tablet, and mobile phone a few hours before bedtime. Do not take long naps during the day. If you nap, limit it to 30 minutes. Have a relaxing bedtime routine. Reading or listening to music may relax you and help you sleep. Use your bedroom only for sleep. Keep your television and computer out of your bedroom. Keep your bedroom cool, dark, and quiet. Use a supportive mattress and pillows. Follow your health care provider's instructions for other changes to sleep habits. Nutrition Do not eat heavy meals in the evening. Do not have caffeine in the later part of the day. The effects of caffeine can last for more than 5 hours. Follow your health care provider's or dietitian's instructions for any diet changes. Lifestyle     Do not drink alcohol before bedtime. Alcohol can cause you to fall asleep at first, but then it can cause you to wake up in the middle of the night and have trouble getting back to sleep. Do not use any products that contain nicotine or tobacco. These products include cigarettes, chewing tobacco, and vaping devices, such as e-cigarettes. If you need help quitting, ask your health care provider. Medicines Take   over-the-counter and prescription medicines only as told by your health care provider. Do not use over-the-counter sleep medicine. You can become dependent on this medicine, and it can make sleep apnea worse. Do not use medicines, such as sedatives and narcotics, unless told by your health care provider. Activity Exercise on most days, but avoid exercising in the evening. Exercising near bedtime can interfere with  sleeping. If possible, spend time outside every day. Natural light helps regulate your circadian rhythm. General information Lose weight if you need to, and maintain a healthy weight. Keep all follow-up visits. This is important. If you are having surgery, make sure to tell your health care provider that you have sleep apnea. You may need to bring your device with you. Where to find more information Learn more about sleep apnea and daytime fatigue from: American Sleep Association: sleepassociation.org National Sleep Foundation: sleepfoundation.org National Heart, Lung, and Blood Institute: nhlbi.nih.gov Summary Sleep apnea is a condition in which breathing pauses or becomes shallow during sleep. Sleep apnea can cause daytime fatigue and other serious health conditions. You may need to wear a device while sleeping to help keep your airway open. If you are having surgery, make sure to tell your health care provider that you have sleep apnea. You may need to bring your device with you. Making changes to sleep habits, diet, lifestyle, and activity can help you manage sleep apnea. This information is not intended to replace advice given to you by your health care provider. Make sure you discuss any questions you have with your health care provider. Document Revised: 09/06/2020 Document Reviewed: 01/07/2020 Elsevier Patient Education  2023 Elsevier Inc.  

## 2022-12-02 NOTE — Progress Notes (Unsigned)
Sleep Medicine   Office Visit  Patient Name: Jack Marshall DOB: 01/25/1961 MRN [id number]    Chief Complaint: history of OSA  Brief History:  Jack Marshall presents for an initial consult for sleep evaluation and to establish care. Patient has a 16 year history of sleep apnea but has not used his PAP in over a decade but is interested in restarting therapy. Patient reports his sleep quality is poor due to waking tired and EDS. This is noted most nights. The patient's bed partner reports loud snoring and gasping at night. She also notes  he is sleepy driving and dozes off during the day. The patient relates the following symptoms: loud snoring, waking tired, ESS, brain fog, lack of focus, sleepy driving and low energy are also present. The patient goes to sleep at 2:00am and wakes up at 6:00am .  Patient reports that he has trouble getting off to sleep at night. Patient wakes at least 3 times per night. Sleep quality is similar when outside home environment.  Patient has noted some restlessness of his legs at night that would disrupt his sleep.  The patient  relates that he strokes his head behavior during the night according to his wife, and the patient is aware of sleepwalking episodes.  The patient relates no history of psychiatric problems. The Epworth Sleepiness Score is 17 out of 24 .  The patient relates  Cardiovascular risk factors include: history of elevated blood pressure, but not on treatment.  The patient reports when he used CPAP years ago he didn't notice a big difference in how he felt.    ROS  General: (-) fever, (-) chills, (-) night sweat Nose and Sinuses: (-) nasal stuffiness or itchiness, (-) postnasal drip, (-) nosebleeds, (-) sinus trouble. Mouth and Throat: (-) sore throat, (-) hoarseness. Neck: (-) swollen glands, (-) enlarged thyroid, (-) neck pain. Respiratory: - cough, - shortness of breath, - wheezing. Neurologic: - numbness, - tingling. Psychiatric: - anxiety, -  depression Sleep behavior: -sleep paralysis -hypnogogic hallucinations -dream enactment      -vivid dreams -cataplexy -night terrors -sleep walking   Current Medication: Outpatient Encounter Medications as of 12/02/2022  Medication Sig   atorvastatin (LIPITOR) 10 MG tablet Take 1 tablet (10 mg total) by mouth daily.   celecoxib (CELEBREX) 200 MG capsule Take 1 capsule (200 mg total) by mouth 2 (two) times daily. (Patient not taking: Reported on 11/13/2022)   Continuous Glucose Sensor (FREESTYLE LIBRE 3 PLUS SENSOR) MISC Change sensor every 15 days.   metFORMIN (GLUCOPHAGE) 1000 MG tablet Take 1 tablet (1,000 mg total) by mouth 2 (two) times daily with a meal.   Multiple Vitamin (MULTIVITAMIN) capsule Take 1 capsule by mouth daily.   tadalafil (CIALIS) 5 MG tablet Take 1-4 tablets (5-20 mg total) by mouth daily as needed for erectile dysfunction (take 45 minutes prior to sexual activity).   No facility-administered encounter medications on file as of 12/02/2022.    Surgical History: Past Surgical History:  Procedure Laterality Date   COLONOSCOPY     normal colon   COLONOSCOPY W/ POLYPECTOMY  12/09/2005   9 mm TA in ascending colon   COLONOSCOPY WITH PROPOFOL N/A 09/23/2018   Procedure: COLONOSCOPY WITH PROPOFOL;  Surgeon: Toledo, Boykin Nearing, MD;  Location: [facility] ENDOSCOPY;  Service: Gastroenterology;  Laterality: N/A;   EYE SURGERY     TESTICULAR EXPLORATION      Medical History: Past Medical History:  Diagnosis Date   Colon polyp    2009,  most recent in 2020 no polyps   Decreased vision of right eye    due to an accident    Diabetes mellitus without complication (HCC)    type 2   Family history of colon cancer    Hyperlipidemia    Lack of exercise    Overweight    Seasonal allergies    Sleep apnea    does not wear cpap   Testicular mass    right    Ventral hernia     Family History: Non contributory to the present illness  Social History: Social History    Socioeconomic History   Marital status: Married    Spouse name: Not on file   Number of children: Not on file   Years of education: Not on file   Highest education level: Not on file  Occupational History   Not on file  Tobacco Use   Smoking status: Never    Passive exposure: Never   Smokeless tobacco: Never  Vaping Use   Vaping status: Never Used  Substance and Sexual Activity   Alcohol use: Yes   Drug use: Not Currently   Sexual activity: Yes  Other Topics Concern   Not on file  Social History Narrative   Not on file   Social Determinants of Health   Financial Resource Strain: Not on file  Food Insecurity: Not on file  Transportation Needs: Not on file  Physical Activity: Not on file  Stress: Not on file  Social Connections: Not on file  Intimate Partner Violence: Not on file    Vital Signs: Blood pressure 114/71, pulse 93, resp. rate 12, height 5\' 11"  (1.803 m), weight 166 lb (75.3 kg), SpO2 96%. Body mass index is 23.15 kg/m.   Examination: General Appearance: The patient is well-developed, well-nourished, and in no distress. Neck Circumference: 39.0cm Skin: Gross inspection of skin unremarkable. Head: normocephalic, no gross deformities. Eyes: no gross deformities noted. ENT: ears appear grossly normal Neurologic: Alert and oriented. No involuntary movements.    STOP BANG RISK ASSESSMENT S (snore) Have you been told that you snore?     YES   T (tired) Are you often tired, fatigued, or sleepy during the day?   YES  O (obstruction) Do you stop breathing, choke, or gasp during sleep? YES   P (pressure) Do you have or are you being treated for high blood pressure? NO   B (BMI) Is your body index greater than 35 kg/m? NO   A (age) Are you 12 years old or older? YES   N (neck) Do you have a neck circumference greater than 16 inches?   NO   G (gender) Are you a male? YES   TOTAL STOP/BANG "YES" ANSWERS 5                                                                A STOP-Bang score of 2 or less is considered low risk, and a score of 5 or more is high risk for having either moderate or severe OSA. For people who score 3 or 4, doctors may need to perform further assessment to determine how likely they are to have OSA.         EPWORTH SLEEPINESS SCALE:  Scale:  (0)= no  chance of dozing; (1)= slight chance of dozing; (2)= moderate chance of dozing; (3)= high chance of dozing  Chance  Situtation    Sitting and reading: 2    Watching TV: 2    Sitting Inactive in public: 3    As a passenger in car: 2      Lying down to rest: 2    Sitting and talking: 2    Sitting quielty after lunch: 2    In a car, stopped in traffic: 2   TOTAL SCORE:   17 out of 24    SLEEP STUDIES:  PSG (11/2006 at [facility]) AHI 24/hr, RDI 36/hr, REM AHI 22.4/hr, Supine AHI 56.3/hr, min SpO2 77.2% Titration (12/2006 at [facility]) CPAP@ 5 cmH2O   LABS: No results found for this or any previous visit (from the past 2160 hour(s)).  Radiology: No results found.  No results found.  No results found.    Assessment and Plan: Patient Active Problem List   Diagnosis Date Noted   Type 2 diabetes mellitus without complication, without long-term current use of insulin (HCC) 10/20/2018   Elevated BP without diagnosis of hypertension 10/20/2018   Other hyperlipidemia 10/20/2018   Erectile dysfunction 10/20/2018   Family history of colon cancer 10/20/2018  1. OSA (obstructive sleep apnea) PLAN OSA:   Patient evaluation suggests high risk of sleep disordered breathing due to history of OSA in past (AHI of 24) with gasping, witnessed apnea, frequent awakening, daytime sleepiness.   Patient has comorbid cardiovascular risk factors including: ellevated blood pressure which could be exacerbated by pathologic sleep-disordered breathing.  Suggest: PSG to assess/treat the patient's sleep disordered  breathing.   2. Sleepwalking Discussed establishing a safe environment.       General Counseling: I have discussed the findings of the evaluation and examination with Jack Marshall.  I have also discussed any further diagnostic evaluation thatmay be needed or ordered today. Jack Marshall verbalizes understanding of the findings of todays visit. We also reviewed his medications today and discussed drug interactions and side effects including but not limited excessive drowsiness and altered mental states. We also discussed that there is always a risk not just to him but also people around him. he has been encouraged to call the office with any questions or concerns that should arise related to todays visit.  No orders of the defined types were placed in this encounter.       I have personally obtained a history, evaluated the patient, evaluated pertinent data, formulated the assessment and plan and placed orders.   This patient was seen today by Emmaline Kluver, PA-C in collaboration with Dr. Freda Munro.   Yevonne Pax, MD [facility] Diplomate ABMS Pulmonary and Critical Care Medicine Sleep medicine

## 2022-12-04 ENCOUNTER — Telehealth: Payer: Self-pay

## 2022-12-04 DIAGNOSIS — R948 Abnormal results of function studies of other organs and systems: Secondary | ICD-10-CM

## 2022-12-04 NOTE — Telephone Encounter (Signed)
-----   Message from Sondra Come sent at 12/04/2022  8:17 AM EDT ----- Bone scan overall looks good, there were a few small areas on the ribs that likely represent prior trauma, however radiology is recommending a CT chest with and without contrast for further evaluation, please order and will call with those results  Legrand Rams, MD 12/04/2022

## 2022-12-05 NOTE — Telephone Encounter (Signed)
Pt calls and questions when his CT scan will be. Advised pt that we do not schedule radiology studies and provided him with the number to radiology scheduling.

## 2022-12-12 ENCOUNTER — Encounter: Payer: Self-pay | Admitting: Internal Medicine

## 2022-12-12 ENCOUNTER — Ambulatory Visit
Admission: RE | Admit: 2022-12-12 | Discharge: 2022-12-12 | Disposition: A | Discharge status: Home / Self Care | Payer: 59 | Source: Ambulatory Visit | Attending: Urology | Admitting: Urology

## 2022-12-12 DIAGNOSIS — R948 Abnormal results of function studies of other organs and systems: Secondary | ICD-10-CM | POA: Insufficient documentation

## 2022-12-12 DIAGNOSIS — R937 Abnormal findings on diagnostic imaging of other parts of musculoskeletal system: Secondary | ICD-10-CM | POA: Diagnosis not present

## 2022-12-12 DIAGNOSIS — K760 Fatty (change of) liver, not elsewhere classified: Secondary | ICD-10-CM | POA: Diagnosis not present

## 2022-12-12 LAB — POCT I-STAT CREATININE: Creatinine, Ser: 1.4 mg/dL — ABNORMAL HIGH (ref 0.61–1.24)

## 2022-12-12 MED ORDER — IOHEXOL 300 MG/ML  SOLN
75.0000 mL | Freq: Once | INTRAMUSCULAR | Status: AC | PRN
  Administered 2022-12-12: 75 mL via INTRAVENOUS

## 2023-01-06 ENCOUNTER — Encounter
Admission: RE | Admit: 2023-01-06 | Discharge: 2023-01-06 | Disposition: A | Discharge status: Home / Self Care | Payer: 59 | Source: Ambulatory Visit | Attending: Urology | Admitting: Urology

## 2023-01-06 ENCOUNTER — Other Ambulatory Visit: Payer: Self-pay

## 2023-01-06 ENCOUNTER — Encounter: Payer: Self-pay | Admitting: Urology

## 2023-01-06 VITALS — Wt 166.0 lb

## 2023-01-06 DIAGNOSIS — E119 Type 2 diabetes mellitus without complications: Secondary | ICD-10-CM

## 2023-01-06 DIAGNOSIS — Z01812 Encounter for preprocedural laboratory examination: Secondary | ICD-10-CM

## 2023-01-06 NOTE — Progress Notes (Signed)
Pre -admit interview completed over the phone. Patient instructions has been given to patient. In addition, patient needs lab works done on day of surgery due to patient is out of town and unable to come to pre-admit prior to surgery. Patient verbalized understanding.

## 2023-01-06 NOTE — Patient Instructions (Addendum)
Your procedure is scheduled on: 01/13/2023 Monday Report to the Registration Desk on the 1st floor of the [facility]. To find out your arrival time, please call [phone] between 1PM - 3PM on: Nov 29 /Friday If your arrival time is 6:00 am, do not arrive before that time as the [facility] entrance doors do not open until 6:00 am.  REMEMBER: Instructions that are not followed completely may result in serious medical risk, up to and including death; or upon the discretion of your surgeon and anesthesiologist your surgery may need to be rescheduled.  Do not eat food after midnight the night before surgery.  No gum chewing or hard candies.    One week prior to surgery: Stop Anti-inflammatories (NSAIDS) such as Advil, Aleve, Ibuprofen, Motrin, Naproxen, Naprosyn and Aspirin based products such as Excedrin, Goody's Powder, BC Powder. Stop ANY OVER THE COUNTER supplements until after surgery.  You may however, continue to take Tylenol if needed for pain up until the day of surgery.  **Follow guidelines for insulin and diabetes medications.**  Hold metformin on Saturday and Sunday. Last dose is Nov.29     Continue taking all of your other prescription medications up until the day of surgery.  ON THE DAY OF SURGERY ONLY TAKE THESE MEDICATIONS WITH SIPS OF WATER:  Atorvastatin   No Alcohol for 24 hours before or after surgery.  No Smoking including e-cigarettes for 24 hours before surgery.  No chewable tobacco products for at least 6 hours before surgery.  No nicotine patches on the day of surgery.  Do not use any "recreational" drugs for at least a week (preferably 2 weeks) before your surgery.  Please be advised that the combination of cocaine and anesthesia may have negative outcomes, up to and including death. If you test positive for cocaine, your surgery will be cancelled.  On the morning of surgery brush your teeth with toothpaste and water, you may rinse your mouth with  mouthwash if you wish. Do not swallow any toothpaste or mouthwash.  Use CHG Soap or wipes as directed on instruction sheet.-Ask Pre-op RN for a full set of CHG wipes.  Do not wear jewelry, make-up, hairpins, clips or nail polish.  For welded (permanent) jewelry: bracelets, anklets, waist bands, etc.  Please have this removed prior to surgery.  If it is not removed, there is a chance that hospital personnel will need to cut it off on the day of surgery.  Do not wear lotions, powders, or perfumes.   Do not shave body hair from the neck down 48 hours before surgery.  Contact lenses, hearing aids and dentures may not be worn into surgery.  Do not bring valuables to the hospital. [facility] is not responsible for any missing/lost belongings or valuables.    Notify your doctor if there is any change in your medical condition (cold, fever, infection).  Wear comfortable clothing (specific to your surgery type) to the hospital.  After surgery, you can help prevent lung complications by doing breathing exercises.  Take deep breaths and cough every 1-2 hours. Your doctor may order a device called an Incentive Spirometer to help you take deep breaths. When coughing or sneezing, hold a pillow firmly against your incision with both hands. This is called "splinting." Doing this helps protect your incision. It also decreases belly discomfort.  If you are being admitted to the hospital overnight, leave your suitcase in the car. After surgery it may be brought to your room.  In  case of increased patient census, it may be necessary for you, the patient, to continue your postoperative care in the Same Day Surgery department.  If you are being discharged the day of surgery, you will not be allowed to drive home. You will need a responsible individual to drive you home and stay with you for 24 hours after surgery.    Please call the Pre-admissions Testing Dept. at [phone] if you have any  questions about these instructions.  Surgery Visitation Policy:  Patients having surgery or a procedure may have two visitors.  Children under the age of 86 must have an adult with them who is not the patient.  Inpatient Visitation:    Visiting hours are 7 a.m. to 8 p.m. Up to four visitors are allowed at one time in a patient room. The visitors may rotate out with other people during the day.  One visitor age 22 or older may stay with the patient overnight and must be in the room by 8 p.m.       Preparing for Surgery with CHLORHEXIDINE GLUCONATE (CHG) Soap  Chlorhexidine Gluconate (CHG) Soap  o An antiseptic cleaner that kills germs and bonds with the skin to continue killing germs even after washing  o Used for showering the night before surgery and morning of surgery  Before surgery, you can play an important role by reducing the number of germs on your skin.  CHG (Chlorhexidine gluconate) soap is an antiseptic cleanser which kills germs and bonds with the skin to continue killing germs even after washing.  Please do not use if you have an allergy to CHG or antibacterial soaps. If your skin becomes reddened/irritated stop using the CHG.  1. Shower the NIGHT BEFORE SURGERY and the MORNING OF SURGERY with CHG soap.  2. If you choose to wash your hair, wash your hair first as usual with your normal shampoo.  3. After shampooing, rinse your hair and body thoroughly to remove the shampoo.  4. Use CHG as you would any other liquid soap. You can apply CHG directly to the skin and wash gently with a scrungie or a clean washcloth.  5. Apply the CHG soap to your body only from the neck down. Do not use on open wounds or open sores. Avoid contact with your eyes, ears, mouth, and genitals (private parts). Wash face and genitals (private parts) with your normal soap.  6. Wash thoroughly, paying special attention to the area where your surgery will be performed.  7. Thoroughly rinse  your body with warm water.  8. Do not shower/wash with your normal soap after using and rinsing off the CHG soap.  9. Pat yourself dry with a clean towel.  10. Wear clean pajamas to bed the night before surgery.  12. Place clean sheets on your bed the night of your first shower and do not sleep with pets.  13. Shower again with the CHG soap on the day of surgery prior to arriving at the hospital.  14. Do not apply any deodorants/lotions/powders.  15. Please wear clean clothes to the hospital.

## 2023-01-13 ENCOUNTER — Ambulatory Visit: Payer: 59

## 2023-01-13 ENCOUNTER — Encounter
Admission: RE | Disposition: A | Discharge status: Home / Self Care | Payer: Self-pay | Source: Ambulatory Visit | Attending: Urology

## 2023-01-13 ENCOUNTER — Observation Stay
Admission: RE | Admit: 2023-01-13 | Discharge: 2023-01-15 | Disposition: A | Discharge status: Home / Self Care | Payer: 59 | Source: Ambulatory Visit | Attending: Urology | Admitting: Urology

## 2023-01-13 ENCOUNTER — Ambulatory Visit: Payer: 59 | Admitting: Urgent Care

## 2023-01-13 ENCOUNTER — Other Ambulatory Visit: Payer: Self-pay

## 2023-01-13 ENCOUNTER — Encounter: Payer: Self-pay | Admitting: Urology

## 2023-01-13 DIAGNOSIS — Z01812 Encounter for preprocedural laboratory examination: Secondary | ICD-10-CM

## 2023-01-13 DIAGNOSIS — E119 Type 2 diabetes mellitus without complications: Secondary | ICD-10-CM | POA: Insufficient documentation

## 2023-01-13 DIAGNOSIS — R9431 Abnormal electrocardiogram [ECG] [EKG]: Secondary | ICD-10-CM | POA: Diagnosis not present

## 2023-01-13 DIAGNOSIS — C61 Malignant neoplasm of prostate: Principal | ICD-10-CM | POA: Insufficient documentation

## 2023-01-13 HISTORY — PX: ROBOT ASSISTED LAPAROSCOPIC RADICAL PROSTATECTOMY: SHX5141

## 2023-01-13 HISTORY — PX: PELVIC LYMPH NODE DISSECTION: SHX6543

## 2023-01-13 HISTORY — DX: Malignant neoplasm of prostate: C61

## 2023-01-13 LAB — CBC
HCT: 41 % (ref 39.0–52.0)
Hemoglobin: 13.2 g/dL (ref 13.0–17.0)
MCH: 30.6 pg (ref 26.0–34.0)
MCHC: 32.2 g/dL (ref 30.0–36.0)
MCV: 95.1 fL (ref 80.0–100.0)
Platelets: 266 10*3/uL (ref 150–400)
RBC: 4.31 MIL/uL (ref 4.22–5.81)
RDW: 11.9 % (ref 11.5–15.5)
WBC: 7.2 10*3/uL (ref 4.0–10.5)
nRBC: 0 % (ref 0.0–0.2)

## 2023-01-13 LAB — ABO/RH: ABO/RH(D): O POS

## 2023-01-13 LAB — BASIC METABOLIC PANEL
Anion gap: 9 (ref 5–15)
BUN: 18 mg/dL (ref 8–23)
CO2: 25 mmol/L (ref 22–32)
Calcium: 9.7 mg/dL (ref 8.9–10.3)
Chloride: 103 mmol/L (ref 98–111)
Creatinine, Ser: 1.26 mg/dL — ABNORMAL HIGH (ref 0.61–1.24)
GFR, Estimated: >60 mL/min (ref >60)
Glucose, Bld: 135 mg/dL — ABNORMAL HIGH (ref 70–99)
Potassium: 4 mmol/L (ref 3.5–5.1)
Sodium: 137 mmol/L (ref 135–145)

## 2023-01-13 LAB — GLUCOSE, CAPILLARY
Glucose-Capillary: 130 mg/dL — ABNORMAL HIGH (ref 70–99)
Glucose-Capillary: 187 mg/dL — ABNORMAL HIGH (ref 70–99)
Glucose-Capillary: 149 mg/dL — ABNORMAL HIGH (ref 70–99)

## 2023-01-13 LAB — TYPE AND SCREEN
ABO/RH(D): O POS
Antibody Screen: NEGATIVE

## 2023-01-13 LAB — PROTIME-INR
INR: 1 (ref 0.8–1.2)
Prothrombin Time: 13.6 s (ref 11.4–15.2)

## 2023-01-13 SURGERY — PROSTATECTOMY, RADICAL, ROBOT-ASSISTED, LAPAROSCOPIC
Anesthesia: General | Site: Prostate

## 2023-01-13 MED ORDER — ROCURONIUM BROMIDE 10 MG/ML (PF) SYRINGE
PREFILLED_SYRINGE | INTRAVENOUS | Status: AC
  Filled 2023-01-13: qty 10

## 2023-01-13 MED ORDER — SODIUM CHLORIDE 0.9% FLUSH
3.0000 mL | Freq: Two times a day (BID) | INTRAVENOUS | Status: DC
  Administered 2023-01-13 – 2023-01-15 (×5): 3 mL via INTRAVENOUS

## 2023-01-13 MED ORDER — PHENYLEPHRINE 80 MCG/ML (10ML) SYRINGE FOR IV PUSH (FOR BLOOD PRESSURE SUPPORT)
PREFILLED_SYRINGE | INTRAVENOUS | Status: AC
  Filled 2023-01-13: qty 10

## 2023-01-13 MED ORDER — ROCURONIUM BROMIDE 100 MG/10ML IV SOLN
INTRAVENOUS | Status: DC | PRN
  Administered 2023-01-13: 10 mg via INTRAVENOUS
  Administered 2023-01-13: 60 mg via INTRAVENOUS
  Administered 2023-01-13: 20 mg via INTRAVENOUS

## 2023-01-13 MED ORDER — OXYCODONE HCL 5 MG/5ML PO SOLN: 5.0000 mg | Freq: Once | ORAL | Status: AC | PRN

## 2023-01-13 MED ORDER — DEXAMETHASONE SODIUM PHOSPHATE 10 MG/ML IJ SOLN
INTRAMUSCULAR | Status: AC
  Filled 2023-01-13: qty 1

## 2023-01-13 MED ORDER — FENTANYL CITRATE (PF) 100 MCG/2ML IJ SOLN
INTRAMUSCULAR | Status: DC | PRN
  Administered 2023-01-13 (×2): 50 ug via INTRAVENOUS

## 2023-01-13 MED ORDER — SODIUM CHLORIDE 0.9% FLUSH: 10.0000 mL | Freq: Two times a day (BID) | INTRAVENOUS | Status: DC

## 2023-01-13 MED ORDER — ATORVASTATIN CALCIUM 10 MG PO TABS
10.0000 mg | ORAL_TABLET | Freq: Every day | ORAL | Status: DC
Start: 2023-01-13 — End: 2023-01-15
  Administered 2023-01-13 – 2023-01-14 (×2): 10 mg via ORAL
  Filled 2023-01-13 (×2): qty 1

## 2023-01-13 MED ORDER — HEMOSTATIC AGENTS (NO CHARGE) OPTIME
TOPICAL | Status: DC | PRN
Start: 2023-01-13 — End: 2023-01-13
  Administered 2023-01-13: 1 via TOPICAL

## 2023-01-13 MED ORDER — BUPIVACAINE HCL (PF) 0.5 % IJ SOLN
INTRAMUSCULAR | Status: AC
  Filled 2023-01-13: qty 30

## 2023-01-13 MED ORDER — STERILE WATER FOR IRRIGATION IR SOLN
Status: DC | PRN
  Administered 2023-01-13: 3000 mL

## 2023-01-13 MED ORDER — DEXMEDETOMIDINE HCL IN NACL 80 MCG/20ML IV SOLN
INTRAVENOUS | Status: DC | PRN
  Administered 2023-01-13: 4 ug via INTRAVENOUS
  Administered 2023-01-13: 8 ug via INTRAVENOUS
  Administered 2023-01-13: 4 ug via INTRAVENOUS

## 2023-01-13 MED ORDER — HYDROMORPHONE HCL 1 MG/ML IJ SOLN
0.5000 mg | INTRAMUSCULAR | Status: DC | PRN
Start: 2023-01-13 — End: 2023-01-15
  Administered 2023-01-14: 1 mg via INTRAVENOUS
  Filled 2023-01-13: qty 1

## 2023-01-13 MED ORDER — LACTATED RINGERS IV SOLN: INTRAVENOUS | Status: AC

## 2023-01-13 MED ORDER — FENTANYL CITRATE (PF) 100 MCG/2ML IJ SOLN
INTRAMUSCULAR | Status: AC
  Filled 2023-01-13: qty 2

## 2023-01-13 MED ORDER — CHLORHEXIDINE GLUCONATE 4 % EX SOLN
60.0000 mL | Freq: Once | CUTANEOUS | Status: AC
Start: 2023-01-13 — End: 2023-01-13
  Administered 2023-01-13: 4 via TOPICAL

## 2023-01-13 MED ORDER — OXYBUTYNIN CHLORIDE 5 MG PO TABS: 5.0000 mg | ORAL_TABLET | Freq: Three times a day (TID) | ORAL | Status: DC | PRN

## 2023-01-13 MED ORDER — ACETAMINOPHEN 10 MG/ML IV SOLN: 1000.0000 mg | Freq: Once | INTRAVENOUS | Status: DC | PRN

## 2023-01-13 MED ORDER — FENTANYL CITRATE (PF) 100 MCG/2ML IJ SOLN
25.0000 ug | INTRAMUSCULAR | Status: DC | PRN
  Administered 2023-01-13: 25 ug via INTRAVENOUS
  Administered 2023-01-13: 50 ug via INTRAVENOUS
  Administered 2023-01-13: 25 ug via INTRAVENOUS

## 2023-01-13 MED ORDER — PROPOFOL 10 MG/ML IV BOLUS
INTRAVENOUS | Status: AC
  Filled 2023-01-13: qty 20

## 2023-01-13 MED ORDER — ONDANSETRON HCL 4 MG/2ML IJ SOLN: 4.0000 mg | Freq: Once | INTRAMUSCULAR | Status: DC | PRN

## 2023-01-13 MED ORDER — SODIUM CHLORIDE 0.9% FLUSH: 3.0000 mL | INTRAVENOUS | Status: DC | PRN

## 2023-01-13 MED ORDER — SUGAMMADEX SODIUM 200 MG/2ML IV SOLN
INTRAVENOUS | Status: DC | PRN
  Administered 2023-01-13: 200 mg via INTRAVENOUS

## 2023-01-13 MED ORDER — DEXMEDETOMIDINE HCL IN NACL 80 MCG/20ML IV SOLN
INTRAVENOUS | Status: AC
  Filled 2023-01-13: qty 20

## 2023-01-13 MED ORDER — SODIUM CHLORIDE 0.45 % IV SOLN: INTRAVENOUS | Status: AC

## 2023-01-13 MED ORDER — CEFAZOLIN SODIUM-DEXTROSE 2-4 GM/100ML-% IV SOLN
INTRAVENOUS | Status: AC
  Filled 2023-01-13: qty 100

## 2023-01-13 MED ORDER — ORAL CARE MOUTH RINSE: 15.0000 mL | Freq: Once | OROMUCOSAL | Status: AC

## 2023-01-13 MED ORDER — HEPARIN SODIUM (PORCINE) 5000 UNIT/ML IJ SOLN
5000.0000 [IU] | Freq: Three times a day (TID) | INTRAMUSCULAR | Status: DC
  Administered 2023-01-14 – 2023-01-15 (×4): 5000 [IU] via SUBCUTANEOUS
  Filled 2023-01-13 (×4): qty 1

## 2023-01-13 MED ORDER — OXYCODONE HCL 5 MG PO TABS
ORAL_TABLET | ORAL | Status: AC
  Filled 2023-01-13: qty 1

## 2023-01-13 MED ORDER — CHLORHEXIDINE GLUCONATE 4 % EX SOLN: 60.0000 mL | Freq: Once | CUTANEOUS | Status: DC

## 2023-01-13 MED ORDER — ONDANSETRON HCL 4 MG/2ML IJ SOLN: 4.0000 mg | INTRAMUSCULAR | Status: DC | PRN

## 2023-01-13 MED ORDER — CHLORHEXIDINE GLUCONATE 0.12 % MT SOLN
15.0000 mL | Freq: Once | OROMUCOSAL | Status: AC
  Administered 2023-01-13: 15 mL via OROMUCOSAL

## 2023-01-13 MED ORDER — HEPARIN SODIUM (PORCINE) 5000 UNIT/ML IJ SOLN
INTRAMUSCULAR | Status: DC | PRN
Start: 2023-01-13 — End: 2023-01-13
  Administered 2023-01-13: 5000 [IU] via SUBCUTANEOUS

## 2023-01-13 MED ORDER — ACETAMINOPHEN 10 MG/ML IV SOLN
INTRAVENOUS | Status: DC | PRN
  Administered 2023-01-13: 1000 mg via INTRAVENOUS

## 2023-01-13 MED ORDER — DEXAMETHASONE SODIUM PHOSPHATE 10 MG/ML IJ SOLN
INTRAMUSCULAR | Status: DC | PRN
  Administered 2023-01-13: 5 mg via INTRAVENOUS

## 2023-01-13 MED ORDER — LIDOCAINE HCL (CARDIAC) PF 100 MG/5ML IV SOSY
PREFILLED_SYRINGE | INTRAVENOUS | Status: DC | PRN
  Administered 2023-01-13: 100 mg via INTRAVENOUS

## 2023-01-13 MED ORDER — METFORMIN HCL 500 MG PO TABS
1000.0000 mg | ORAL_TABLET | Freq: Two times a day (BID) | ORAL | Status: DC
  Administered 2023-01-13 – 2023-01-15 (×4): 1000 mg via ORAL
  Filled 2023-01-13 (×4): qty 2

## 2023-01-13 MED ORDER — CEFAZOLIN SODIUM-DEXTROSE 2-4 GM/100ML-% IV SOLN
2.0000 g | INTRAVENOUS | Status: AC
  Administered 2023-01-13: 2 g via INTRAVENOUS

## 2023-01-13 MED ORDER — ONDANSETRON HCL 4 MG/2ML IJ SOLN
INTRAMUSCULAR | Status: AC
  Filled 2023-01-13: qty 2

## 2023-01-13 MED ORDER — CHLORHEXIDINE GLUCONATE 0.12 % MT SOLN
OROMUCOSAL | Status: AC
  Filled 2023-01-13: qty 15

## 2023-01-13 MED ORDER — MIDAZOLAM HCL 2 MG/2ML IJ SOLN
INTRAMUSCULAR | Status: DC | PRN
  Administered 2023-01-13: 2 mg via INTRAVENOUS

## 2023-01-13 MED ORDER — PROPOFOL 10 MG/ML IV BOLUS
INTRAVENOUS | Status: DC | PRN
  Administered 2023-01-13: 120 mg via INTRAVENOUS

## 2023-01-13 MED ORDER — MIDAZOLAM HCL 2 MG/2ML IJ SOLN
INTRAMUSCULAR | Status: AC
  Filled 2023-01-13: qty 2

## 2023-01-13 MED ORDER — SODIUM CHLORIDE 0.9 % IV SOLN: INTRAVENOUS | Status: DC | PRN

## 2023-01-13 MED ORDER — LIDOCAINE HCL (PF) 2 % IJ SOLN
INTRAMUSCULAR | Status: AC
  Filled 2023-01-13: qty 5

## 2023-01-13 MED ORDER — ONDANSETRON HCL 4 MG/2ML IJ SOLN
INTRAMUSCULAR | Status: DC | PRN
  Administered 2023-01-13: 4 mg via INTRAVENOUS

## 2023-01-13 MED ORDER — SODIUM CHLORIDE 0.9 % IV SOLN
250.0000 mL | INTRAVENOUS | Status: AC | PRN
Start: 2023-01-14 — End: 2023-01-14

## 2023-01-13 MED ORDER — OXYCODONE HCL 5 MG PO TABS
5.0000 mg | ORAL_TABLET | Freq: Once | ORAL | Status: AC | PRN
  Administered 2023-01-13: 5 mg via ORAL

## 2023-01-13 MED ORDER — ACETAMINOPHEN 500 MG PO TABS
1000.0000 mg | ORAL_TABLET | Freq: Four times a day (QID) | ORAL | Status: AC
  Administered 2023-01-13 – 2023-01-14 (×2): 1000 mg via ORAL
  Filled 2023-01-13 (×2): qty 2

## 2023-01-13 MED ORDER — HEPARIN SODIUM (PORCINE) 5000 UNIT/ML IJ SOLN
INTRAMUSCULAR | Status: AC
  Filled 2023-01-13: qty 1

## 2023-01-13 MED ORDER — HYDROCODONE-ACETAMINOPHEN 5-325 MG PO TABS
1.0000 | ORAL_TABLET | ORAL | Status: DC | PRN
  Administered 2023-01-13 – 2023-01-14 (×2): 1 via ORAL
  Administered 2023-01-14 – 2023-01-15 (×4): 2 via ORAL
  Filled 2023-01-13 (×2): qty 2
  Filled 2023-01-13 (×2): qty 1
  Filled 2023-01-13 (×2): qty 2

## 2023-01-13 MED ORDER — ACETAMINOPHEN 10 MG/ML IV SOLN
INTRAVENOUS | Status: AC
  Filled 2023-01-13: qty 100

## 2023-01-13 MED ORDER — DOCUSATE SODIUM 100 MG PO CAPS
100.0000 mg | ORAL_CAPSULE | Freq: Two times a day (BID) | ORAL | Status: DC
  Administered 2023-01-13 – 2023-01-15 (×4): 100 mg via ORAL
  Filled 2023-01-13 (×4): qty 1

## 2023-01-13 MED ORDER — SURGIFLO WITH THROMBIN (HEMOSTATIC MATRIX KIT) OPTIME
TOPICAL | Status: DC | PRN
  Administered 2023-01-13: 1 via TOPICAL

## 2023-01-13 MED ORDER — BUPIVACAINE HCL 0.5 % IJ SOLN
INTRAMUSCULAR | Status: DC | PRN
  Administered 2023-01-13: 20 mL

## 2023-01-13 SURGICAL SUPPLY — 82 items
ANCHOR TIS RET SYS 235ML (MISCELLANEOUS) ×2 IMPLANT
APPLICATOR SURGIFLO ENDO (HEMOSTASIS) ×2 IMPLANT
BAG PRESSURE INF REUSE 1000 (BAG) IMPLANT
BAG URINE DRAIN 2000ML AR STRL (UROLOGICAL SUPPLIES) ×2 IMPLANT
BLADE CLIPPER SURG (BLADE) ×2 IMPLANT
CATH FOL 2WAY LX 18X5 (CATHETERS) ×2 IMPLANT
CATH FOL 2WAY LX 20X5 (CATHETERS) IMPLANT
CHLORAPREP W/TINT 26 (MISCELLANEOUS) ×4 IMPLANT
CLIP LIGATING HEM O LOK PURPLE (MISCELLANEOUS) ×6 IMPLANT
COVER TIP SHEARS 8 DVNC (MISCELLANEOUS) ×2 IMPLANT
DERMABOND ADVANCED .7 DNX12 (GAUZE/BANDAGES/DRESSINGS) ×2 IMPLANT
DRAIN CHANNEL JP 15F RND 3/16 (MISCELLANEOUS) IMPLANT
DRAIN CHANNEL JP 19F RND 3/16 (MISCELLANEOUS) IMPLANT
DRAPE 3/4 80X56 (DRAPES) ×2 IMPLANT
DRAPE ARM DVNC X/XI (DISPOSABLE) ×8 IMPLANT
DRAPE COLUMN DVNC XI (DISPOSABLE) ×2 IMPLANT
DRAPE LEGGINS SURG 28X43 STRL (DRAPES) ×2 IMPLANT
DRAPE SURG 17X11 SM STRL (DRAPES) ×8 IMPLANT
DRAPE UNDER BUTTOCK W/FLU (DRAPES) ×2 IMPLANT
DRIVER NDL LRG 8 DVNC XI (INSTRUMENTS) ×2 IMPLANT
DRIVER NDLE LRG 8 DVNC XI (INSTRUMENTS) ×2 IMPLANT
DRSG TEGADERM 6X8 (GAUZE/BANDAGES/DRESSINGS) IMPLANT
DRSG TELFA 3X8 NADH STRL (GAUZE/BANDAGES/DRESSINGS) ×2 IMPLANT
ELECT REM PT RETURN 9FT ADLT (ELECTROSURGICAL) ×2
ELECTRODE REM PT RTRN 9FT ADLT (ELECTROSURGICAL) ×2 IMPLANT
EVACUATOR SILICONE 100CC (DRAIN) IMPLANT
FORCEPS BPLR 8 MD DVNC XI (FORCEP) ×2 IMPLANT
FORCEPS BPLR FENES DVNC XI (FORCEP) ×2 IMPLANT
FORCEPS PROGRASP DVNC XI (FORCEP) ×2 IMPLANT
GLOVE BIOGEL PI IND STRL 7.5 (GLOVE) ×6 IMPLANT
GOWN STRL REUS W/ TWL LRG LVL3 (GOWN DISPOSABLE) ×4 IMPLANT
GOWN STRL REUS W/ TWL XL LVL3 (GOWN DISPOSABLE) ×4 IMPLANT
GRASPER SUT TROCAR 14GX15 (MISCELLANEOUS) ×2 IMPLANT
HEMOSTAT SURGICEL 2X14 (HEMOSTASIS) IMPLANT
HOLDER FOLEY CATH W/STRAP (MISCELLANEOUS) ×2 IMPLANT
IRRIGATION STRYKERFLOW (MISCELLANEOUS) ×2 IMPLANT
IRRIGATOR STRYKERFLOW (MISCELLANEOUS) ×2
KIT PINK PAD W/HEAD ARE REST (MISCELLANEOUS) ×2
KIT PINK PAD W/HEAD ARM REST (MISCELLANEOUS) ×2 IMPLANT
LABEL OR SOLS (LABEL) ×2 IMPLANT
MANIFOLD NEPTUNE II (INSTRUMENTS) ×2 IMPLANT
NDL HYPO 22X1.5 SAFETY MO (MISCELLANEOUS) ×2 IMPLANT
NDL INSUFFLATION 14GA 120MM (NEEDLE) ×2 IMPLANT
NEEDLE HYPO 22X1.5 SAFETY MO (MISCELLANEOUS) ×2 IMPLANT
NEEDLE INSUFFLATION 14GA 120MM (NEEDLE) ×2 IMPLANT
NS IRRIG 500ML POUR BTL (IV SOLUTION) ×2 IMPLANT
OBTURATOR OPTICAL STND 8 DVNC (TROCAR) ×2
OBTURATOR OPTICALSTD 8 DVNC (TROCAR) ×2 IMPLANT
PACK LAP CHOLECYSTECTOMY (MISCELLANEOUS) ×2 IMPLANT
RELOAD STAPLE 60 2.6 WHT THN (STAPLE) IMPLANT
RELOAD STAPLER WHITE 60MM (STAPLE) ×2 IMPLANT
SCISSORS MNPLR CVD DVNC XI (INSTRUMENTS) ×2 IMPLANT
SEAL UNIV 5-12 XI (MISCELLANEOUS) ×8 IMPLANT
SET TUBE SMOKE EVAC HIGH FLOW (TUBING) ×2 IMPLANT
SOL ELECTROSURG ANTI STICK (MISCELLANEOUS) ×2
SOLUTION ELECTROSURG ANTI STCK (MISCELLANEOUS) ×2 IMPLANT
SPONGE T-LAP 4X18 ~~LOC~~+RFID (SPONGE) ×2 IMPLANT
SPONGE VERSALON 4X4 4PLY (MISCELLANEOUS) IMPLANT
STAPLE ECHEON FLEX 60 POW ENDO (STAPLE) IMPLANT
STAPLER RELOAD WHITE 60MM (STAPLE) ×2
STAPLER SKIN PROX 35W (STAPLE) ×2 IMPLANT
STRAP SAFETY 5IN WIDE (MISCELLANEOUS) ×2 IMPLANT
SURGIFLO W/THROMBIN 8M KIT (HEMOSTASIS) ×2 IMPLANT
SURGILUBE 2OZ TUBE FLIPTOP (MISCELLANEOUS) ×2 IMPLANT
SUT ETHILON 3-0 FS-10 30 BLK (SUTURE)
SUT MNCRL 4-0 27XMFL (SUTURE) ×4
SUT PROLENE 5 0 RB 1 DA (SUTURE) IMPLANT
SUT SILK 2 0 SH (SUTURE) IMPLANT
SUT STRATA 3-0 15 RB-1 (SUTURE) ×2 IMPLANT
SUT STRATA 3-0 15 RB-1.5 (SUTURE) ×4 IMPLANT
SUT VIC AB 0 CT1 36 (SUTURE) ×4 IMPLANT
SUT VIC AB 2-0 CT1 (SUTURE) IMPLANT
SUT VICRYL 0 UR6 27IN ABS (SUTURE) ×2 IMPLANT
SUTURE EHLN 3-0 FS-10 30 BLK (SUTURE) IMPLANT
SUTURE MNCRL 4-0 27XMF (SUTURE) ×4 IMPLANT
SYR TOOMEY 50ML (SYRINGE) ×2 IMPLANT
TAPE CLOTH 3X10 WHT NS LF (GAUZE/BANDAGES/DRESSINGS) ×4 IMPLANT
TRAP FLUID SMOKE EVACUATOR (MISCELLANEOUS) ×2 IMPLANT
TROCAR Z-THREAD FIOS 12X100MM (TROCAR) ×2 IMPLANT
TROCAR Z-THREAD FIOS 5X100MM (TROCAR) ×2 IMPLANT
WATER STERILE IRR 3000ML UROMA (IV SOLUTION) ×2 IMPLANT
WATER STERILE IRR 500ML POUR (IV SOLUTION) ×2 IMPLANT

## 2023-01-13 NOTE — Plan of Care (Signed)
  Problem: Education: Goal: Knowledge of the procedure and recovery process will improve Outcome: Progressing   Problem: Bowel/Gastric: Goal: Gastrointestinal status for postoperative course will improve Outcome: Progressing   Problem: Pain Management: Goal: General experience of comfort will improve Outcome: Progressing   Problem: Skin Integrity: Goal: Demonstration of wound healing without infection will improve Outcome: Progressing   Problem: Urinary Elimination: Goal: Ability to avoid or minimize complications of infection will improve Outcome: Progressing Goal: Ability to achieve and maintain urine output will improve Outcome: Progressing Goal: Home care management will improve Outcome: Progressing   Problem: Education: Goal: Knowledge of General Education information will improve Description: Including pain rating scale, medication(s)/side effects and non-pharmacologic comfort measures Outcome: Progressing   Problem: Health Behavior/Discharge Planning: Goal: Ability to manage health-related needs will improve Outcome: Progressing   Problem: Clinical Measurements: Goal: Ability to maintain clinical measurements within normal limits will improve Outcome: Progressing Goal: Will remain free from infection Outcome: Progressing Goal: Diagnostic test results will improve Outcome: Progressing Goal: Respiratory complications will improve Outcome: Progressing Goal: Cardiovascular complication will be avoided Outcome: Progressing   Problem: Activity: Goal: Risk for activity intolerance will decrease Outcome: Progressing   Problem: Nutrition: Goal: Adequate nutrition will be maintained Outcome: Progressing   Problem: Coping: Goal: Level of anxiety will decrease Outcome: Progressing   Problem: Elimination: Goal: Will not experience complications related to bowel motility Outcome: Progressing Goal: Will not experience complications related to urinary  retention Outcome: Progressing   Problem: Pain Management: Goal: General experience of comfort will improve Outcome: Progressing   Problem: Safety: Goal: Ability to remain free from injury will improve Outcome: Progressing   Problem: Skin Integrity: Goal: Risk for impaired skin integrity will decrease Outcome: Progressing

## 2023-01-13 NOTE — H&P (Signed)
01/13/23 6:48 AM   Jack Marshall 12-14-1960 [id number]  CC: prostate cancer  HPI: 62 year old male who presented with an elevated PSA of 4.9 was found to have a PI-RADS 5 lesion on MRI, biopsy showed high risk prostate cancer.  Staging imaging showed no evidence of metastatic disease and he opted for radical prostatectomy.  He has ED at baseline, has not yet tried PDE 5 inhibitors.  Denies any prior abdominal surgeries.   PMH: Past Medical History:  Diagnosis Date   Colon polyp    2009, most recent in 2020 no polyps   Decreased vision of right eye    due to an accident    Diabetes mellitus without complication (HCC)    type 2   Family history of colon cancer    Hyperlipidemia    Lack of exercise    Overweight    Prostate cancer (HCC)    Seasonal allergies    Sleep apnea    does not wear cpap   Testicular mass    right    Ventral hernia     Surgical History: Past Surgical History:  Procedure Laterality Date   COLONOSCOPY     normal colon   COLONOSCOPY W/ POLYPECTOMY  12/09/2005   9 mm TA in ascending colon   COLONOSCOPY WITH PROPOFOL N/A 09/23/2018   Procedure: COLONOSCOPY WITH PROPOFOL;  Surgeon: Toledo, Boykin Nearing, MD;  Location: [facility] ENDOSCOPY;  Service: Gastroenterology;  Laterality: N/A;   EYE SURGERY     TESTICULAR EXPLORATION       Family History: Family History  Problem Relation Age of Onset   Diabetes Mother    Colon cancer Father    Prostate cancer Father    Colon cancer Brother     Social History:  reports that he has never smoked. He has never been exposed to tobacco smoke. He has never used smokeless tobacco. He reports current alcohol use. He reports that he does not currently use drugs.  Physical Exam: BP 119/80   Pulse 92   Temp (!) 97 F (36.1 C) (Oral)   Resp 20   Ht 5\' 11"  (1.803 m)   Wt 75.3 kg   SpO2 96%   BMI 23.15 kg/m    Constitutional:  Alert and oriented, No acute distress. Cardiovascular:  Regular rate and  rhythm Respiratory: Clear to auscultation bilaterally GI: Abdomen is soft, nontender, nondistended, no abdominal masses   Assessment & Plan:   62 year old male with high risk prostate cancer, no evidence of metastatic disease on staging imaging.  In regards to surgery, we discussed robotic prostatectomy +/- lymphadenectomy at length.  The procedure takes 3 to 4 hours, and patient's typically discharge home on post-op day #1.  A Foley catheter is left in place for 7 to 10 days to allow for healing of the vesicourethral anastomosis.  There is a small risk of bleeding, infection, damage to surrounding structures or bowel, hernia, DVT/PE, or serious cardiac or pulmonary complications.  We discussed at length post-op side effects including erectile dysfunction, and the importance of pre-operative erectile function on long-term outcomes.  Even with a nerve sparing approach, there is an approximately 25% rate of permanent erectile dysfunction.  We also discussed postop urinary incontinence at length.  We expect patients to have stress incontinence post-operatively that will improve over period of weeks to months.  Less than 10% of men will require a pad at 1 year after surgery.  Patients will need to avoid heavy lifting and strenuous  activity for 3 to 4 weeks, but most men return to their baseline activity status by 6 weeks.  We also discussed risks of more rare, but serious complications including MI, stroke, DVT/PE, bowel injury, urine leak.  Robotic radical prostatectomy and lymph node dissection today    Legrand Rams, MD 01/13/2023  [facility] [address], [address] [place], [place] [address] [phone]

## 2023-01-13 NOTE — Anesthesia Procedure Notes (Signed)
Procedure Name: Intubation Date/Time: 01/13/2023 7:38 AM  Performed by: Dalton Mille, [place], CRNAPre-anesthesia Checklist: Patient identified, Patient being monitored, Timeout performed, Emergency Drugs available and Suction available Patient Re-evaluated:Patient Re-evaluated prior to induction Oxygen Delivery Method: Circle system utilized Preoxygenation: Pre-oxygenation with 100% oxygen Induction Type: IV induction Ventilation: Mask ventilation without difficulty Laryngoscope Size: McGrath and 4 Grade View: Grade I Tube type: Oral Tube size: 7.0 mm Number of attempts: 1 Airway Equipment and Method: Stylet Placement Confirmation: ETT inserted through vocal cords under direct vision, positive ETCO2 and breath sounds checked- equal and bilateral Secured at: 22 cm Tube secured with: Tape Dental Injury: Teeth and Oropharynx as per pre-operative assessment

## 2023-01-13 NOTE — Op Note (Signed)
Date of procedure: 01/13/2023   Preoperative diagnosis:  High risk prostate cancer   Postoperative diagnosis:  Same   Procedure: Da Vinci robotic radical prostatectomy Bilateral pelvic lymph node dissection   Surgeon: Legrand Rams, MD   Assistant: Vanna Scotland, MD  (An assistant was required for this surgical procedure.  The duties of the assistant included but were not limited to suctioning, passing suture, camera manipulation, retraction. This procedure would not be able to be performed without an assistant)   Anesthesia: General   Complications: None   Intraoperative findings:  1.  Uncomplicated robotic prostatectomy and pelvic LN dissection 2.  Watertight vesicourethral anastomosis   EBL: 100 cc   Specimens:  1.  Prostate and seminal vesicles 2. Right pelvic lymph nodes 3. Left pelvic lymph nodes   Drains: 20 French 2-way Foley   Indication: Jack Marshall is a 62 y.o. male with high risk prostate cancer   We discussed treatment options including surgery or radiation, and he elected for robotic prostatectomy.  After reviewing the management options for treatment, they elected to proceed with the above surgical procedure(s). We have discussed the potential benefits and risks of the procedure, side effects of the proposed treatment, the likelihood of the patient achieving the goals of the procedure, and any potential problems that might occur during the procedure or recuperation. Informed consent has been obtained. We specifically discussed the risks of bleeding, infection, bowel injury, injury to surrounding structures, incontinence, erectile dysfunction, urine leak, possible need for prolonged foley or drain placement, need for long term PSA monitoring, risk of recurrence of disease, and possible need for salvage radiation in the future.   Description of procedure:   The patient was taken to the operating room and general anesthesia was induced. SCDs were placed for  DVT prophylaxis, and 5000 units subcutaneous heparin were given.  The patient was placed in the dorsal lithotomy position, carefully padded with the arms against the sides, prepped and draped in the usual sterile fashion, and preoperative antibiotics(Ancef) were administered. A preoperative time-out was performed.    On the field, an 32 [place] Foley catheter passed easily into the bladder with return of clear yellow urine.  An 8 mm incision was made above the umbilicus, and a Veress needle was used to obtain access to the peritoneum.  The abdomen was insufflated to 15 mmHg, and the robotic ports were placed in standard fashion under direct vision. A Carter-Thomason device was used to pre-place an 0-vicryl suture in the right lateral 11mm assistant port under direct vision. Thorough inspection of the abdomen showed no bowel injury or other abnormal findings.  Lidocaine was injected into all port sites prior to placement.  The patient was then placed in steep Trendelenburg position and the robot was docked.   I started by entering the space of Retzius and taking down the bladder.  Once this was free to the vas deferens bilaterally, the periprostatic fat was cleaned off the prostate and sent for permanent.  The endopelvic fascia was opened bilaterally, and muscle fibers spared laterally.  The periprostatic ligaments were carefully taken down.  The DVC was isolated, and ligated using a 60 mm load vascular stapler.    I then turned my attention to the anterior bladder neck, and this was incised down to the level of the Foley catheter.  The Foley catheter was removed from the bladder and gently held upward for traction.  The posterior bladder neck was carefully divided in "V" technique, and we identified  the plane between the prostate and the seminal vesicles.  The bladder wings were taken down and step-down fashion, and weck clips used for hemostasis. The ampulla of the vas was divided bilaterally, and held up for  retraction.  The seminal vesicles were then dissected out bilaterally intact.  [place]iers fascia was opened posteriorly to the prostate and freed laterally. The pedicles were taken down using weck clips, and cautery was minimized. A nerve spare was performed. The prostate was then gently retracted superiorly, and the apex was dissected free.  Once the urethra was identified, the use of electrocautery was minimized.  The urethra was sharply entered and the Foley catheter removed.  The posterior urethral plate was divided. The specimen was placed in an Endo Catch bag and moved out of the field.  There was excellent hemostasis in the pelvis.   A lymph node dissection was then performed. I started on the right side and removed the lymphatic tissue in the obturator fossa with the borders of the external iliac vein laterally, node of Cloquet distally, the bladder medially, and the obturator nerve. An identical procedure was performed on the left side. Weck clips were used to clip visualized lymphatics.   A 3-0 V lock stitch was used to reapproximate the tissue behind the bladder and posteriorly to the urethra as described by Rocco in order to remove tension from the anastomosis.  Two 3-0 V lock stitches were connected and used for the urethrovesical anastomosis.  A running anastomosis was performed circumferentially and there appeared to be good approximation of the mucosa on both sides.  A new 20 French Foley passed easily into the bladder. With the sutures tied anteriorly and 12cc in the balloon, 120 cc normal saline were instilled with no leakage noted in the operative field.  A anterior urethral suspension was performed by passing the sutures through the posterior aspect of the pubic bone with gentle tension to aid in return to continence.  There was excellent hemostasis.  A small piece of Surgicel was placed on either side of the anastomosis along the endopelvic fascia in addition to Surgi-Flo, as well as  in the obturator fossa bilaterally. All instruments were removed and the robot was undocked.   The supraumbilical incision was extended superiorly, and the specimen was removed.  The pre-placed fascial suture in the 11mm right lateral assistant port was tied down. The fascia of the small midline incision was closed using a running 0 Vicryl.  All port sites were copiously irrigated.  There was good hemostasis.  A running 4-0 Monocryl was used to close all incisions, and Dermabond was applied.   All counts were correct, and the patient was awakened and taken to PACU in stable condition.   Disposition: Stable to PACU   Plan: Anticipate discharge home tomorrow with catheter in place for 1 week  Legrand Rams, MD 01/13/2023

## 2023-01-13 NOTE — Plan of Care (Signed)
Problem: Education: Goal: Knowledge of the procedure and recovery process will improve 01/13/2023 1752 by Oneita Kras, RN Outcome: Progressing 01/13/2023 1751 by Oneita Kras, RN Outcome: Progressing   Problem: Bowel/Gastric: Goal: Gastrointestinal status for postoperative course will improve 01/13/2023 1752 by Oneita Kras, RN Outcome: Progressing 01/13/2023 1751 by Oneita Kras, RN Outcome: Progressing   Problem: Pain Management: Goal: General experience of comfort will improve 01/13/2023 1752 by Oneita Kras, RN Outcome: Progressing 01/13/2023 1751 by Oneita Kras, RN Outcome: Progressing   Problem: Skin Integrity: Goal: Demonstration of wound healing without infection will improve 01/13/2023 1752 by Oneita Kras, RN Outcome: Progressing 01/13/2023 1751 by Oneita Kras, RN Outcome: Progressing   Problem: Urinary Elimination: Goal: Ability to avoid or minimize complications of infection will improve 01/13/2023 1752 by Oneita Kras, RN Outcome: Progressing 01/13/2023 1751 by Oneita Kras, RN Outcome: Progressing Goal: Ability to achieve and maintain urine output will improve 01/13/2023 1752 by Oneita Kras, RN Outcome: Progressing 01/13/2023 1751 by Oneita Kras, RN Outcome: Progressing Goal: Home care management will improve 01/13/2023 1752 by Oneita Kras, RN Outcome: Progressing 01/13/2023 1751 by Oneita Kras, RN Outcome: Progressing   Problem: Education: Goal: Knowledge of General Education information will improve Description: Including pain rating scale, medication(s)/side effects and non-pharmacologic comfort measures 01/13/2023 1752 by Oneita Kras, RN Outcome: Progressing 01/13/2023 1751 by Oneita Kras, RN Outcome: Progressing   Problem: Health Behavior/Discharge Planning: Goal: Ability to manage health-related needs will  improve 01/13/2023 1752 by Oneita Kras, RN Outcome: Progressing 01/13/2023 1751 by Oneita Kras, RN Outcome: Progressing   Problem: Clinical Measurements: Goal: Ability to maintain clinical measurements within normal limits will improve 01/13/2023 1752 by Oneita Kras, RN Outcome: Progressing 01/13/2023 1751 by Oneita Kras, RN Outcome: Progressing Goal: Will remain free from infection 01/13/2023 1752 by Oneita Kras, RN Outcome: Progressing 01/13/2023 1751 by Oneita Kras, RN Outcome: Progressing Goal: Diagnostic test results will improve 01/13/2023 1752 by Oneita Kras, RN Outcome: Progressing 01/13/2023 1751 by Oneita Kras, RN Outcome: Progressing Goal: Respiratory complications will improve 01/13/2023 1752 by Oneita Kras, RN Outcome: Progressing 01/13/2023 1751 by Oneita Kras, RN Outcome: Progressing Goal: Cardiovascular complication will be avoided 01/13/2023 1752 by Oneita Kras, RN Outcome: Progressing 01/13/2023 1751 by Oneita Kras, RN Outcome: Progressing   Problem: Activity: Goal: Risk for activity intolerance will decrease 01/13/2023 1752 by Oneita Kras, RN Outcome: Progressing 01/13/2023 1751 by Oneita Kras, RN Outcome: Progressing   Problem: Nutrition: Goal: Adequate nutrition will be maintained 01/13/2023 1752 by Oneita Kras, RN Outcome: Progressing 01/13/2023 1751 by Oneita Kras, RN Outcome: Progressing   Problem: Coping: Goal: Level of anxiety will decrease 01/13/2023 1752 by Oneita Kras, RN Outcome: Progressing 01/13/2023 1751 by Oneita Kras, RN Outcome: Progressing   Problem: Elimination: Goal: Will not experience complications related to bowel motility 01/13/2023 1752 by Oneita Kras, RN Outcome: Progressing 01/13/2023 1751 by Oneita Kras, RN Outcome: Progressing Goal:  Will not experience complications related to urinary retention 01/13/2023 1752 by Oneita Kras, RN Outcome: Progressing 01/13/2023 1751 by Oneita Kras, RN Outcome: Progressing   Problem: Pain Management: Goal: General experience of comfort will improve 01/13/2023 1752 by Oneita Kras, RN Outcome: Progressing 01/13/2023 1751 by Oneita Kras, RN Outcome: Progressing   Problem: Safety: Goal: Ability to remain free from injury will improve 01/13/2023 1752  by Oneita Kras, RN Outcome: Progressing 01/13/2023 1751 by Oneita Kras, RN Outcome: Progressing   Problem: Skin Integrity: Goal: Risk for impaired skin integrity will decrease 01/13/2023 1752 by Oneita Kras, RN Outcome: Progressing 01/13/2023 1751 by Oneita Kras, RN Outcome: Progressing

## 2023-01-13 NOTE — Anesthesia Postprocedure Evaluation (Signed)
Anesthesia Post Note  Patient: Novella Rob  Procedure(s) Performed: XI ROBOTIC ASSISTED LAPAROSCOPIC RADICAL PROSTATECTOMY (Prostate) PELVIC LYMPH NODE DISSECTION (Bilateral: Prostate)  Patient location during evaluation: PACU Anesthesia Type: General Level of consciousness: awake and alert, oriented and patient cooperative Pain management: pain level controlled Vital Signs Assessment: post-procedure vital signs reviewed and stable Respiratory status: spontaneous breathing, nonlabored ventilation and respiratory function stable Cardiovascular status: blood pressure returned to baseline and stable Postop Assessment: adequate PO intake Anesthetic complications: no   No notable events documented.   Last Vitals:  Vitals:   01/13/23 1145 01/13/23 1200  BP: 127/86 113/77  Pulse: 83 89  Resp: (!) 5 16  Temp:    SpO2: 95% 94%    Last Pain:  Vitals:   01/13/23 1130  TempSrc:   PainSc: Asleep                 Reed Breech

## 2023-01-13 NOTE — Anesthesia Preprocedure Evaluation (Addendum)
Anesthesia Evaluation  Patient identified by MRN, date of birth, ID band Patient awake    Reviewed: Allergy & Precautions, NPO status , Patient's Chart, lab work & pertinent test results  History of Anesthesia Complications Negative for: history of anesthetic complications  Airway Mallampati: III   Neck ROM: Full    Dental no notable dental hx.    Pulmonary sleep apnea    Pulmonary exam normal breath sounds clear to auscultation       Cardiovascular Exercise Tolerance: Good negative cardio ROS Normal cardiovascular exam Rhythm:Regular Rate:Normal  ECG 01/13/23:  Normal sinus rhythm Incomplete right bundle branch block Borderline ECG When compared with ECG of 10-May-2005 21:12, No significant change was found   Neuro/Psych negative neurological ROS     GI/Hepatic negative GI ROS,,,  Endo/Other  diabetes, Type 2    Renal/GU negative Renal ROS   Prostate CA    Musculoskeletal   Abdominal   Peds  Hematology negative hematology ROS (+)   Anesthesia Other Findings   Reproductive/Obstetrics                             Anesthesia Physical Anesthesia Plan  ASA: 2  Anesthesia Plan: General   Post-op Pain Management:    Induction: Intravenous  PONV Risk Score and Plan: 2 and Ondansetron, Dexamethasone and Treatment may vary due to age or medical condition  Airway Management Planned: Oral ETT  Additional Equipment:   Intra-op Plan:   Post-operative Plan: Extubation in OR  Informed Consent: I have reviewed the patients History and Physical, chart, labs and discussed the procedure including the risks, benefits and alternatives for the proposed anesthesia with the patient or authorized representative who has indicated his/her understanding and acceptance.     Dental advisory given  Plan Discussed with: CRNA  Anesthesia Plan Comments: (Patient consented for risks of anesthesia  including but not limited to:  - adverse reactions to medications - damage to eyes, teeth, lips or other oral mucosa - nerve damage due to positioning  - sore throat or hoarseness - damage to heart, brain, nerves, lungs, other parts of body or loss of life  Informed patient about role of CRNA in peri- and intra-operative care.  Patient voiced understanding.)        Anesthesia Quick Evaluation

## 2023-01-13 NOTE — Transfer of Care (Signed)
Immediate Anesthesia Transfer of Care Note  Patient: Jack Marshall  Procedure(s) Performed: XI ROBOTIC ASSISTED LAPAROSCOPIC RADICAL PROSTATECTOMY (Prostate) PELVIC LYMPH NODE DISSECTION (Bilateral: Prostate)  Patient Location: PACU  Anesthesia Type:General  Level of Consciousness: awake  Airway & Oxygen Therapy: Patient connected to face mask oxygen  Post-op Assessment: Report given to RN  Post vital signs: Reviewed and stable  Last Vitals:  Vitals Value Taken Time  BP 128/89 01/13/23 1031  Temp 36.2 C 01/13/23 1030  Pulse 75 01/13/23 1036  Resp 15 01/13/23 1036  SpO2 100 % 01/13/23 1036  Vitals shown include unfiled device data.  Last Pain:  Vitals:   01/13/23 1478  TempSrc: Oral  PainSc: 0-No pain         Complications: No notable events documented.

## 2023-01-14 ENCOUNTER — Encounter: Payer: Self-pay | Admitting: Physician Assistant

## 2023-01-14 DIAGNOSIS — C61 Malignant neoplasm of prostate: Secondary | ICD-10-CM | POA: Diagnosis not present

## 2023-01-14 DIAGNOSIS — E119 Type 2 diabetes mellitus without complications: Secondary | ICD-10-CM | POA: Diagnosis not present

## 2023-01-14 LAB — CBC
HCT: 38.7 % — ABNORMAL LOW (ref 39.0–52.0)
Hemoglobin: 12.9 g/dL — ABNORMAL LOW (ref 13.0–17.0)
MCH: 30.7 pg (ref 26.0–34.0)
MCHC: 33.3 g/dL (ref 30.0–36.0)
MCV: 92.1 fL (ref 80.0–100.0)
Platelets: 244 10*3/uL (ref 150–400)
RBC: 4.2 MIL/uL — ABNORMAL LOW (ref 4.22–5.81)
RDW: 11.9 % (ref 11.5–15.5)
WBC: 12.4 10*3/uL — ABNORMAL HIGH (ref 4.0–10.5)
nRBC: 0 % (ref 0.0–0.2)

## 2023-01-14 LAB — BASIC METABOLIC PANEL
Anion gap: 8 (ref 5–15)
BUN: 9 mg/dL (ref 8–23)
CO2: 25 mmol/L (ref 22–32)
Calcium: 8.9 mg/dL (ref 8.9–10.3)
Chloride: 102 mmol/L (ref 98–111)
Creatinine, Ser: 0.95 mg/dL (ref 0.61–1.24)
GFR, Estimated: >60 mL/min (ref >60)
Glucose, Bld: 136 mg/dL — ABNORMAL HIGH (ref 70–99)
Potassium: 3.8 mmol/L (ref 3.5–5.1)
Sodium: 135 mmol/L (ref 135–145)

## 2023-01-14 LAB — GLUCOSE, CAPILLARY
Glucose-Capillary: 165 mg/dL — ABNORMAL HIGH (ref 70–99)
Glucose-Capillary: 149 mg/dL — ABNORMAL HIGH (ref 70–99)

## 2023-01-14 NOTE — Plan of Care (Signed)
  Problem: Education: Goal: Knowledge of the procedure and recovery process will improve Outcome: Progressing   Problem: Bowel/Gastric: Goal: Gastrointestinal status for postoperative course will improve Outcome: Progressing   Problem: Pain Management: Goal: General experience of comfort will improve Outcome: Progressing   Problem: Skin Integrity: Goal: Demonstration of wound healing without infection will improve Outcome: Progressing   Problem: Urinary Elimination: Goal: Ability to avoid or minimize complications of infection will improve Outcome: Progressing Goal: Ability to achieve and maintain urine output will improve Outcome: Progressing Goal: Home care management will improve Outcome: Progressing   Problem: Education: Goal: Knowledge of General Education information will improve Description: Including pain rating scale, medication(s)/side effects and non-pharmacologic comfort measures Outcome: Progressing   Problem: Health Behavior/Discharge Planning: Goal: Ability to manage health-related needs will improve Outcome: Progressing   Problem: Clinical Measurements: Goal: Ability to maintain clinical measurements within normal limits will improve Outcome: Progressing Goal: Will remain free from infection Outcome: Progressing Goal: Diagnostic test results will improve Outcome: Progressing Goal: Respiratory complications will improve Outcome: Progressing Goal: Cardiovascular complication will be avoided Outcome: Progressing   Problem: Activity: Goal: Risk for activity intolerance will decrease Outcome: Progressing   Problem: Nutrition: Goal: Adequate nutrition will be maintained Outcome: Progressing   Problem: Coping: Goal: Level of anxiety will decrease Outcome: Progressing   Problem: Elimination: Goal: Will not experience complications related to bowel motility Outcome: Progressing Goal: Will not experience complications related to urinary  retention Outcome: Progressing   Problem: Pain Management: Goal: General experience of comfort will improve Outcome: Progressing   Problem: Safety: Goal: Ability to remain free from injury will improve Outcome: Progressing   Problem: Skin Integrity: Goal: Risk for impaired skin integrity will decrease Outcome: Progressing

## 2023-01-14 NOTE — Progress Notes (Signed)
Mobility Specialist - Progress Note   01/14/23 1157  Mobility  Activity Ambulated with assistance in hallway;Ambulated with assistance to bathroom  Level of Assistance Standby assist, set-up cues, supervision of patient - no hands on  Assistive Device Front wheel walker  Distance Ambulated (ft) 160 ft  Activity Response Tolerated well  $Mobility charge 1 Mobility  Mobility Specialist Start Time (ACUTE ONLY) 1125  Mobility Specialist Stop Time (ACUTE ONLY) 1137  Mobility Specialist Time Calculation (min) (ACUTE ONLY) 12 min   Pt standing in the bathroom upon entry, utilizing RA. Pt motivated and willing to amb in the hallway this date, expressed 7/10 pain. Pt amb one lap around the NS using RW with supervision, tolerated well. Pt reported increased pain in incision site with mobility 8/10, RN notified. Pt returned to the room, left supine with alarm set and needs within reach.   Zetta Bills Mobility Specialist 01/14/23 12:13 PM

## 2023-01-14 NOTE — Progress Notes (Signed)
Urology Inpatient Progress Note  Subjective: No acute events overnight.  He is afebrile, VSS. Creatinine down today, 0.95.  WBC count up, 12.4.  Hemoglobin rather stable, 12.9. Pain is controlled this morning and he is passing flatus.  Foley catheter in place draining clear, yellow urine.  No nausea or vomiting.  Diet advanced. As of this afternoon, he has ambulated with assistance from nursing and had some increased pain with this.  He prefers to stay overnight for pain control, which is reasonable.  Anti-infectives: Anti-infectives (From admission, onward)    Start     Dose/Rate Route Frequency Ordered Stop   01/13/23 0618  ceFAZolin (ANCEF) IVPB 2g/100 mL premix        2 g 200 mL/hr over 30 Minutes Intravenous 30 min pre-op 01/13/23 0618 01/13/23 0749       Current Facility-Administered Medications  Medication Dose Route Frequency Provider Last Rate Last Admin   0.9 %  sodium chloride infusion  250 mL Intravenous PRN Sondra Come, MD       atorvastatin (LIPITOR) tablet 10 mg  10 mg Oral Daily Sondra Come, MD   10 mg at 01/13/23 2119   docusate sodium (COLACE) capsule 100 mg  100 mg Oral BID Sondra Come, MD   100 mg at 01/14/23 0834   heparin injection 5,000 Units  5,000 Units Subcutaneous Q8H Sondra Come, MD   5,000 Units at 01/14/23 1313   HYDROcodone-acetaminophen (NORCO/VICODIN) 5-325 MG per tablet 1-2 tablet  1-2 tablet Oral Q4H PRN Sondra Come, MD   2 tablet at 01/14/23 1622   HYDROmorphone (DILAUDID) injection 0.5-1 mg  0.5-1 mg Intravenous Q2H PRN Sondra Come, MD   1 mg at 01/14/23 1447   metFORMIN (GLUCOPHAGE) tablet 1,000 mg  1,000 mg Oral BID WC Legrand Rams C, MD   1,000 mg at 01/14/23 1623   ondansetron (ZOFRAN) injection 4 mg  4 mg Intravenous Q4H PRN Sondra Come, MD       oxybutynin (DITROPAN) tablet 5 mg  5 mg Oral Q8H PRN Sondra Come, MD       sodium chloride flush (NS) 0.9 % injection 3 mL  3 mL Intravenous Q12H Sondra Come, MD   3 mL at 01/14/23 0834   sodium chloride flush (NS) 0.9 % injection 3 mL  3 mL Intravenous PRN Sondra Come, MD         Objective: Vital signs in last 24 hours: Temp:  [97.6 F (36.4 C)-99.3 F (37.4 C)] 97.6 F (36.4 C) (12/03 1656) Pulse Rate:  [88-105] 97 (12/03 1656) Resp:  [16-18] 16 (12/03 1656) BP: (110-146)/(78-94) 110/81 (12/03 1656) SpO2:  [95 %-98 %] 95 % (12/03 1656)  Intake/Output from previous day: 12/02 0701 - 12/03 0700 In: 1296.2 [P.O.:240; I.V.:856.2; IV Piggyback:200] Out: 2450 [Urine:2350; Blood:100] Intake/Output this shift: No intake/output data recorded.  Physical Exam Vitals and nursing note reviewed.  Constitutional:      General: He is not in acute distress.    Appearance: He is not ill-appearing, toxic-appearing or diaphoretic.  HENT:     Head: Normocephalic and atraumatic.  Pulmonary:     Effort: Pulmonary effort is normal. No respiratory distress.  Abdominal:     Palpations: Abdomen is soft.     Comments: Appropriate postoperative tenderness without rigidity or rebound.  Incisions clean, dry, and intact with overlying surgical adhesive.  Skin:    General: Skin is warm and dry.  Neurological:  Mental Status: He is alert and oriented to person, place, and time.  Psychiatric:        Mood and Affect: Mood normal.        Behavior: Behavior normal.    Lab Results:  Recent Labs    01/13/23 0658 01/14/23 0447  WBC 7.2 12.4*  HGB 13.2 12.9*  HCT 41.0 38.7*  PLT 266 244   BMET Recent Labs    01/13/23 0658 01/14/23 0447  NA 137 135  K 4.0 3.8  CL 103 102  CO2 25 25  GLUCOSE 135* 136*  BUN 18 9  CREATININE 1.26* 0.95  CALCIUM 9.7 8.9   PT/INR Recent Labs    01/13/23 0658  LABPROT 13.6  INR 1.0   Assessment & Plan: 62 year old male POD 1 from robotic assisted laparoscopic radical prostatectomy with Dr. Richardo Hanks for management of prostate cancer  He has not yet met his treatment goal of controlled pain, will  plan to keep him overnight for pain control.  Otherwise, doing well with regard to tolerating p.o., ambulation, and Foley continues to drain well.  Anticipate discharge tomorrow.  Carman Ching, PA-C 01/14/2023

## 2023-01-14 NOTE — Discharge Summary (Signed)
Date of admission: 01/13/2023  Date of discharge: 01/15/2023  Admission diagnosis: Prostate cancer  Discharge diagnosis: Same as above  Secondary diagnoses:  Patient Active Problem List   Diagnosis Date Noted   Prostate cancer (HCC) 01/13/2023   OSA (obstructive sleep apnea) 12/02/2022   Sleepwalking 12/02/2022   Type 2 diabetes mellitus without complication, without long-term current use of insulin (HCC) 10/20/2018   Elevated BP without diagnosis of hypertension 10/20/2018   Other hyperlipidemia 10/20/2018   Erectile dysfunction 10/20/2018   Family history of colon cancer 10/20/2018   History and Physical: For full details, please see admission history and physical. Briefly, Jack Marshall is a 62 y.o. year old patient admitted on 01/13/2023 for scheduled robotic assisted laparoscopic radical prostatectomy with bilateral pelvic lymph node dissection with Dr. Richardo Hanks for management of high risk prostate cancer.   Hospital Course: Patient tolerated the procedure well.  He was then transferred to the floor after an uneventful PACU stay.  His hospital course was uncomplicated.  On POD#2 he had met discharge criteria: was eating a carb modified diet, was up and ambulating independently,  pain was well controlled, catheter was draining well, and was ready for discharge.  Physical Exam: Blood pressure 121/80, pulse 96, temperature 99.3 F (37.4 C), temperature source Oral, resp. rate 18, height 5\' 11"  (1.803 m), weight 75.3 kg, SpO2 97%.  Constitutional:  Well nourished. Alert and oriented, No acute distress. HEENT: [place] AT, moist mucus membranes.  Trachea midline Cardiovascular: No clubbing, cyanosis, or edema. Respiratory: Normal respiratory effort, no increased work of breathing. GI: Abdomen is soft, non tender, mildly distended.  Incisions clean and dry.   GU: No CVA tenderness.  No bladder fullness or masses.  Patient with normal phallus.  Foley in place draining clear yellow urine.   Skin: No rashes, bruises or suspicious lesions. Lymph: No cervical or inguinal adenopathy. Neurologic: Grossly intact, no focal deficits, moving all 4 extremities. Psychiatric: Normal mood and affect.   Laboratory values:  Recent Labs    01/13/23 0658 01/14/23 0447  WBC 7.2 12.4*  HGB 13.2 12.9*  HCT 41.0 38.7*   Recent Labs    01/13/23 0658 01/14/23 0447  NA 137 135  K 4.0 3.8  CL 103 102  CO2 25 25  GLUCOSE 135* 136*  BUN 18 9  CREATININE 1.26* 0.95  CALCIUM 9.7 8.9   Recent Labs    01/13/23 0658  INR 1.0   Results for orders placed or performed in visit on 12/25/18  Novel Coronavirus, NAA ([facility])     Status: None   Collection Time: 12/25/18 12:00 AM   Specimen: Nasopharyngeal(NP) swabs in vial transport medium   NASOPHARYNGE  TESTING  Result Value Ref Range Status   SARS-CoV-2, NAA Not Detected Not Detected Final    Comment: This nucleic acid amplification test was developed and its performance characteristics determined by [employer]. Nucleic acid amplification tests include PCR and TMA. This test has not been FDA cleared or approved. This test has been authorized by FDA under an Emergency Use Authorization (EUA). This test is only authorized for the duration of time the declaration that circumstances exist justifying the authorization of the emergency use of in vitro diagnostic tests for detection of SARS-CoV-2 virus and/or diagnosis of COVID-19 infection under section 564(b)(1) of the Act, 21 U.S.C. 161WRU-0(A) (1), unless the authorization is terminated or revoked sooner. When diagnostic testing is negative, the possibility of a false negative result should be considered in the context of  a patient's recent exposures and the presence of clinical signs and symptoms consistent with COVID-19. An individual without symptoms of COVID-19 and who is not shedding SARS-CoV-2 virus would  expect to have a negative (not detected) result in this assay.     Disposition: Home  Discharge instruction: The patient was instructed to be ambulatory but told to refrain from heavy lifting, strenuous activity, or driving.  Foley care instructions provided by nursing.  Discharge medications:  Allergies as of 01/15/2023   No Known Allergies      Medication List     TAKE these medications    atorvastatin 10 MG tablet Commonly known as: LIPITOR Take 1 tablet (10 mg total) by mouth daily.   b complex vitamins capsule Take 1 capsule by mouth daily.   docusate sodium 100 MG capsule Commonly known as: COLACE Take 1 capsule (100 mg total) by mouth 2 (two) times daily.   FreeStyle Libre 3 Plus Sensor Misc Change sensor every 15 days.   HYDROcodone-acetaminophen 5-325 MG tablet Commonly known as: NORCO/VICODIN Take 1-2 tablets by mouth every 4 (four) hours as needed for moderate pain (pain score 4-6).   metFORMIN 1000 MG tablet Commonly known as: GLUCOPHAGE Take 1 tablet (1,000 mg total) by mouth 2 (two) times daily with a meal.   multivitamin capsule Take 1 capsule by mouth daily.   oxybutynin 5 MG tablet Commonly known as: DITROPAN Take 1 tablet (5 mg total) by mouth every 8 (eight) hours as needed for bladder spasms.   VITAMIN C PO Take 1 tablet by mouth daily.        Followup:   Follow-up Information     Carman Ching, [place]. Go on 01/20/2023.   Specialty: Urology Why: For catheter removal Contact information: [address] [place] [address] [phone]

## 2023-01-15 DIAGNOSIS — E119 Type 2 diabetes mellitus without complications: Secondary | ICD-10-CM | POA: Diagnosis not present

## 2023-01-15 DIAGNOSIS — C61 Malignant neoplasm of prostate: Secondary | ICD-10-CM | POA: Diagnosis not present

## 2023-01-15 LAB — GLUCOSE, CAPILLARY: Glucose-Capillary: 138 mg/dL — ABNORMAL HIGH (ref 70–99)

## 2023-01-15 MED ORDER — HYDROCODONE-ACETAMINOPHEN 5-325 MG PO TABS: 1.0000 | ORAL_TABLET | ORAL | 0 refills | Status: DC | PRN

## 2023-01-15 MED ORDER — OXYBUTYNIN CHLORIDE 5 MG PO TABS: 5.0000 mg | ORAL_TABLET | Freq: Three times a day (TID) | ORAL | 0 refills | Status: DC | PRN

## 2023-01-15 MED ORDER — DOCUSATE SODIUM 100 MG PO CAPS: 100.0000 mg | ORAL_CAPSULE | Freq: Two times a day (BID) | ORAL | 0 refills | Status: DC

## 2023-01-15 NOTE — TOC CM/SW Note (Signed)
Transition of Care [facility]) - Inpatient Brief Assessment   Patient Details  Name: Jack Marshall MRN: [id number] Date of Birth: 12/07/1960  [facility]) CM/SW Contact:    Margarito Liner, LCSW Phone Number: 01/15/2023, 12:51 PM   Clinical Narrative: Patient has orders to discharge home today. Chart reviewed. No TOC needs identified. CSW signing off.  Transition of Care Asessment: Insurance and Status: Insurance coverage has been reviewed Patient has primary care physician: Yes Home environment has been reviewed: Single family home Prior level of function:: Not documented Prior/Current Home Services: No current home services Social Determinants of Health Reivew: SDOH reviewed no interventions necessary Readmission risk has been reviewed: Yes Transition of care needs: no transition of care needs at this time

## 2023-01-15 NOTE — Plan of Care (Signed)
  Problem: Education: Goal: Knowledge of the procedure and recovery process will improve Outcome: Adequate for Discharge   Problem: Bowel/Gastric: Goal: Gastrointestinal status for postoperative course will improve Outcome: Adequate for Discharge   Problem: Pain Management: Goal: General experience of comfort will improve Outcome: Adequate for Discharge   Problem: Skin Integrity: Goal: Demonstration of wound healing without infection will improve Outcome: Adequate for Discharge   Problem: Urinary Elimination: Goal: Ability to avoid or minimize complications of infection will improve Outcome: Adequate for Discharge Goal: Ability to achieve and maintain urine output will improve Outcome: Adequate for Discharge Goal: Home care management will improve Outcome: Adequate for Discharge   Problem: Education: Goal: Knowledge of General Education information will improve Description: Including pain rating scale, medication(s)/side effects and non-pharmacologic comfort measures Outcome: Adequate for Discharge   Problem: Health Behavior/Discharge Planning: Goal: Ability to manage health-related needs will improve Outcome: Adequate for Discharge   Problem: Clinical Measurements: Goal: Ability to maintain clinical measurements within normal limits will improve Outcome: Adequate for Discharge Goal: Will remain free from infection Outcome: Adequate for Discharge Goal: Diagnostic test results will improve Outcome: Adequate for Discharge Goal: Respiratory complications will improve Outcome: Adequate for Discharge Goal: Cardiovascular complication will be avoided Outcome: Adequate for Discharge   Problem: Activity: Goal: Risk for activity intolerance will decrease Outcome: Adequate for Discharge   Problem: Nutrition: Goal: Adequate nutrition will be maintained Outcome: Adequate for Discharge   Problem: Coping: Goal: Level of anxiety will decrease Outcome: Adequate for Discharge    Problem: Elimination: Goal: Will not experience complications related to bowel motility Outcome: Adequate for Discharge Goal: Will not experience complications related to urinary retention Outcome: Adequate for Discharge   Problem: Pain Management: Goal: General experience of comfort will improve Outcome: Adequate for Discharge   Problem: Safety: Goal: Ability to remain free from injury will improve Outcome: Adequate for Discharge   Problem: Skin Integrity: Goal: Risk for impaired skin integrity will decrease Outcome: Adequate for Discharge    Pt aox4, respirations even and unlabored on RA. Pt has all belongings. Pt discharging with Foley per MD order. Pt pt has received all DC instructions. Pt being taken home by wife. pt taken down to lobby with staff via wheelchair

## 2023-01-15 NOTE — Progress Notes (Signed)
Urology Consult Follow Up  Subjective: No acute events overnight.  Vital signs stable and afebrile.  He continues to pass flatus.  Foley catheter in place with good urine output draining clear yellow urine.  Wife at bedside.    Anti-infectives: Anti-infectives (From admission, onward)    Start     Dose/Rate Route Frequency Ordered Stop   01/13/23 0618  ceFAZolin (ANCEF) IVPB 2g/100 mL premix        2 g 200 mL/hr over 30 Minutes Intravenous 30 min pre-op 01/13/23 0618 01/13/23 0749       Current Facility-Administered Medications  Medication Dose Route Frequency Provider Last Rate Last Admin   atorvastatin (LIPITOR) tablet 10 mg  10 mg Oral Daily Sondra Come, MD   10 mg at 01/14/23 2054   docusate sodium (COLACE) capsule 100 mg  100 mg Oral BID Sondra Come, MD   100 mg at 01/14/23 2054   heparin injection 5,000 Units  5,000 Units Subcutaneous Q8H Sondra Come, MD   5,000 Units at 01/15/23 0547   HYDROcodone-acetaminophen (NORCO/VICODIN) 5-325 MG per tablet 1-2 tablet  1-2 tablet Oral Q4H PRN Sondra Come, MD   1 tablet at 01/14/23 2102   HYDROmorphone (DILAUDID) injection 0.5-1 mg  0.5-1 mg Intravenous Q2H PRN Sondra Come, MD   1 mg at 01/14/23 1447   metFORMIN (GLUCOPHAGE) tablet 1,000 mg  1,000 mg Oral BID WC Legrand Rams C, MD   1,000 mg at 01/14/23 1623   ondansetron (ZOFRAN) injection 4 mg  4 mg Intravenous Q4H PRN Sondra Come, MD       oxybutynin (DITROPAN) tablet 5 mg  5 mg Oral Q8H PRN Sondra Come, MD       sodium chloride flush (NS) 0.9 % injection 3 mL  3 mL Intravenous Q12H Legrand Rams C, MD   3 mL at 01/14/23 2056   sodium chloride flush (NS) 0.9 % injection 3 mL  3 mL Intravenous PRN Sondra Come, MD         Objective: Vital signs in last 24 hours: Temp:  [97.4 F (36.3 C)-98.5 F (36.9 C)] 98.5 F (36.9 C) (12/04 0436) Pulse Rate:  [83-100] 99 (12/04 0436) Resp:  [16-18] 16 (12/04 0436) BP: (110-146)/(80-94) 118/80 (12/04  0436) SpO2:  [95 %-96 %] 96 % (12/04 0436)  Intake/Output from previous day: 12/03 0701 - 12/04 0700 In: 100 [P.O.:100] Out: 1125 [Urine:1125] Intake/Output this shift: No intake/output data recorded.   Physical Exam Vitals and nursing note reviewed.  Constitutional:      General: He is not in acute distress.    Appearance: Normal appearance. He is normal weight. He is not ill-appearing, toxic-appearing or diaphoretic.  HENT:     Head: Normocephalic and atraumatic.     Nose: Nose normal.     Mouth/Throat:     Mouth: Mucous membranes are moist.  Eyes:     Extraocular Movements: Extraocular movements intact.     Conjunctiva/sclera: Conjunctivae normal.     Pupils: Pupils are equal, round, and reactive to light.  Pulmonary:     Effort: Pulmonary effort is normal.  Abdominal:     Palpations: Abdomen is soft.     Comments: Operative incisions are clean and dry.  Abdomen mildly distended and mildly tender in line with postoperative state.   Genitourinary:    Penis: Normal.      Comments: Foley catheter in place draining clear yellow urine. Musculoskeletal:  General: Normal range of motion.  Skin:    General: Skin is warm and dry.  Neurological:     General: No focal deficit present.     Mental Status: He is alert. Mental status is at baseline. He is disoriented.  Psychiatric:        Mood and Affect: Mood normal.        Behavior: Behavior normal.        Thought Content: Thought content normal.        Judgment: Judgment normal.     Lab Results:  Recent Labs    01/13/23 0658 01/14/23 0447  WBC 7.2 12.4*  HGB 13.2 12.9*  HCT 41.0 38.7*  PLT 266 244   BMET Recent Labs    01/13/23 0658 01/14/23 0447  NA 137 135  K 4.0 3.8  CL 103 102  CO2 25 25  GLUCOSE 135* 136*  BUN 18 9  CREATININE 1.26* 0.95  CALCIUM 9.7 8.9   PT/INR Recent Labs    01/13/23 0658  LABPROT 13.6  INR 1.0   ABG No results for input(s): "PHART", "HCO3" in the last 72  hours.  Invalid input(s): "PCO2", "PO2"  Studies/Results: No results found.   Assessment: 62 year old male postop day 2 from robotic assisted laparoscopic radical prostatectomy with Dr. Richardo Hanks for the management of prostate cancer.  Plan: -he will continue ambulation this morning to ensure he is having good pain control with the ambulation -He is tolerating p.o. and Foley continues to drain clear yellow urine -Anticipate discharge this afternoon     LOS: 0 days    [facility] 01/15/2023

## 2023-01-15 NOTE — Discharge Instructions (Signed)
Foley Catheter Care and Patient Education  Perform catheter care every day.  You can do this while in the shower, but NOT while taking a tub bath.  You will need the following supplies: -mild soap, such as Dove -water -a clean washcloth (not one already used for bathing) or a 4x4 piece of gauze -1 Cath-Secure -night drainage bag -2 alcohol swabs  Was you hands thoroughly with soap and water Using mild soap and water, clan your genital area Men should retract the foreskin, if needed, and clean the area, including the penis Women should separate the labia, and clean the area from front to back  3. Clean your urinary opening, which is where the catheter enters your body. 4. Clean the catheter from where it enters your body and then down, away from your body.  Hold the catheter at the point it enters your body so that you don't put tension on it. 5. Rinse the area well and dry it gently.  Changing the drainage bag You will change your drainage bag twice a day -in the morning after you shower, change he night bag to the leg bag -at night before you go to bed, change the leg bag to the night bag  Wash your hands thoroughly with soap and water Empty the urine from the drainage bag into the toilet before you change it Pinch off the catheter with your fingers and disconnect the used bag Wipe the end of the catheter using an alcohol pad Wipe the connector on the bag using the second alcohol pad Connect the clean bag to the catheter and release your finger pinch Check all connections.  Straighten any kinks or twits in the tubing  Caring for the Leg bag -always wear the leg bag below your knee.  This will help it drain. -keep the leg bag secure with the velcro straps.  If the straps leave a mark on your leg they are to tight and should be loosened.  Leaving the straps too tight can decrease you circulation and lead to blood clots. -empty the leg bag through the spout at the bottom every  2-4 hours as needed.  Do not let the bag become completely full. -do not lie down for longer than 2 hours while you are wearing the leg bag.  Caring for the Night Bag -always keep the night bag below the level of your bladder . -to hang your night bag while you sleep, place a clean plastic bag inside of a wastebasket.  Hang the night bag on the inside of the wastebasket.  Cleaning the Drainage bag Wash you hands thoroughly with soap and water. Rinse the equipment with cool water.  Do not use hot water it can damage the plastic equipment. Was the equipment with a mild liquid detergent (ivory) and rinse with cool water. To decrease odor, fill the bag halfway with a mixture of 1 part vinegar and 3 parts water. Shake the bag and let it sit for 15 minutes. Rinse the bag with cool water and hang it up to dry.  Preventing infection -keep the drainage bag below the level of your bladder and off the floor at all times. -keep the catheter secured to your thigh to prevent it from moving. -do not lie on or block the flow of urine in the tubing. -shower daily to keep the catheter clean. -clean your hands before and after touching the catheter or bag. -the spout of the drainage bag should never touch the side of   the toilet or any emptying container.  Special Points -You may see some blood or urine around where the catheter enters your body, especially when walking or having a bowel movement.  This is normal, as long as there is urine draining into the drainage bag.  If you experience significant leakage around  catheter tube where it enters your urethra possibly associated with lower abdominal cramping you could be having a bladder spasm.  Please notify your doctor and we can prescribe you a medication to improve your symptoms. -drink 1-2 glasses of liquids every 2 hours while you're awake.  Call your doctor immediately if -your catheter comes out, do not try to replace it yourself -you have  temperature of 101F (38.8C) or higher -you have bright red blood or large blood clots in your urine -you have abdominal pain and no urine in your catheter bag    Activity:  You are encouraged to ambulate frequently (about every hour during waking hours) to help prevent blood clots from forming in your legs or lungs.  However, you should not engage in any heavy lifting (> 5-10 lbs), strenuous activity, or straining.  Diet: You should advance your diet as instructed by your physician.  It will be normal to have some bloating, nausea, and abdominal discomfort intermittently.  Prescriptions:  You will be provided a prescription for pain medication to take as needed.  If your pain is not severe enough to require the prescription pain medication, you may take extra strength Tylenol instead which will have less side effects.  You should also take a prescribed stool softener to avoid straining with bowel movements as the prescription pain medication may constipate you.  Incisions: You may remove your dressing bandages 48 hours after surgery if not removed in the hospital.  You will either have some small staples or special tissue glue at each of the incision sites. Once the bandages are removed (if present), the incisions may stay open to air.  You may start showering (but not soaking or bathing in water) the 2nd day after surgery and the incisions simply need to be patted dry after the shower.  No additional care is needed.  What to call us about: You should call the office if you develop fever > 101 or develop persistent vomiting, redness or draining around your incision, or any other concerning symptoms.    [facility] [address], [place], [place] [address] [phone]

## 2023-01-16 LAB — SURGICAL PATHOLOGY

## 2023-01-20 ENCOUNTER — Ambulatory Visit: Payer: 59 | Admitting: Physician Assistant

## 2023-01-20 VITALS — BP 109/73 | HR 101

## 2023-01-20 DIAGNOSIS — C61 Malignant neoplasm of prostate: Secondary | ICD-10-CM

## 2023-01-20 MED ORDER — SULFAMETHOXAZOLE-TRIMETHOPRIM 800-160 MG PO TABS
1.0000 | ORAL_TABLET | Freq: Once | ORAL | Status: AC
  Administered 2023-01-20: 1 via ORAL

## 2023-01-20 NOTE — Progress Notes (Signed)
Catheter Removal  Patient is present today for a catheter removal.  11ml of water was drained from the balloon. A 20FR foley cath was removed from the bladder, no complications were noted. Patient tolerated well.  Performed by: Carman Ching, PA-C   Additional notes: Patient denies LE edema, redness, or pain; chest pain; shortness of breath; palpitations. He is having BMs and continues to tolerate PO. Abdomen is soft without rigidity or rebound. Incisions are clean, dry, and intact.  Bactrim DS x1 dose administered prior to Foley removal for UTI prevention.  Surgical pathology results shared with patient by Dr. Richardo Hanks via MyChart; he has seen these.  We discussed anticipated postop urinary leakage. I specifically counseled him to anticipate leakage, wear absorbant products as needed for security, and that his first year postop will show the greatest improvement in leakage.   Follow up: Return in about 4 weeks (around 02/17/2023) for Postop f/u with Dr. Richardo Hanks.

## 2023-02-07 ENCOUNTER — Other Ambulatory Visit: Payer: Self-pay | Admitting: Physician Assistant

## 2023-02-07 DIAGNOSIS — E7849 Other hyperlipidemia: Secondary | ICD-10-CM

## 2023-02-07 DIAGNOSIS — Z0289 Encounter for other administrative examinations: Secondary | ICD-10-CM

## 2023-02-13 ENCOUNTER — Other Ambulatory Visit: Payer: Self-pay

## 2023-02-13 DIAGNOSIS — E119 Type 2 diabetes mellitus without complications: Secondary | ICD-10-CM

## 2023-02-13 DIAGNOSIS — E7849 Other hyperlipidemia: Secondary | ICD-10-CM

## 2023-02-14 MED ORDER — ATORVASTATIN CALCIUM 10 MG PO TABS: 10.0000 mg | ORAL_TABLET | Freq: Every day | ORAL | 3 refills | Status: AC

## 2023-02-14 MED ORDER — METFORMIN HCL 1000 MG PO TABS: 1000.0000 mg | ORAL_TABLET | Freq: Two times a day (BID) | ORAL | 3 refills | Status: DC

## 2023-02-24 ENCOUNTER — Other Ambulatory Visit: Payer: 59

## 2023-02-24 ENCOUNTER — Other Ambulatory Visit: Payer: Self-pay | Admitting: *Deleted

## 2023-02-24 DIAGNOSIS — C61 Malignant neoplasm of prostate: Secondary | ICD-10-CM | POA: Diagnosis not present

## 2023-02-25 LAB — PSA: Prostate Specific Ag, Serum: 0.3 ng/mL (ref 0.0–4.0)

## 2023-02-27 ENCOUNTER — Ambulatory Visit: Payer: 59 | Admitting: Urology

## 2023-02-27 VITALS — BP 125/80 | HR 116 | Ht 71.0 in | Wt 161.9 lb

## 2023-02-27 DIAGNOSIS — C61 Malignant neoplasm of prostate: Secondary | ICD-10-CM

## 2023-02-27 NOTE — Progress Notes (Signed)
   02/27/2023 9:51 AM   Jack Marshall December 25, 1960 [id number]  Reason for visit: Follow up prostate cancer  HPI: 63 year old male who presented with elevated PSA of 4.9 and found to have a PI-RADS 5 lesion on MRI, fusion biopsy in September 2024 showed high risk Gleason score 4+4=8 disease in the ROI.  Staging imaging with MRI pelvis and bone scan showed no evidence of metastatic disease and he opted for radical prostatectomy.  He underwent robotic radical prostatectomy and lymph node dissection on 01/13/2023 with pathology showing high risk disease Gleason score 4+5=9 grade group 5 disease including intraductal carcinoma identified, there is no extra prostatic extension and all margins were negative, lymph nodes were negative.  He continues to have stress incontinence requiring for depends during the day, as well as some urgency and frequency.  He denies any abdominal pain, incisions of healed well on exam today.  We reviewed Kegel exercises and expected postop recovery timeline after radical prostatectomy.  Initial postop PSA is 0.3 concerning for residual disease.  We reviewed these results extensively, and he and his wife had multiple questions.  We discussed the need for repeat PSA next week for confirmation, and that if PSA remains elevated would recommend salvage radiation with ADT.  Kegel exercises reviewed Repeat PSA next week, if remains elevated will refer to radiation oncology for consideration of salvage radiation and ADT, consider PSMA PET scan    Sondra Come, MD  [facility] [address] [place], [place] [address] [phone]

## 2023-03-03 ENCOUNTER — Other Ambulatory Visit: Payer: Self-pay | Admitting: *Deleted

## 2023-03-03 ENCOUNTER — Other Ambulatory Visit: Payer: 59

## 2023-03-03 DIAGNOSIS — C61 Malignant neoplasm of prostate: Secondary | ICD-10-CM | POA: Diagnosis not present

## 2023-03-04 ENCOUNTER — Telehealth: Payer: Self-pay

## 2023-03-04 ENCOUNTER — Other Ambulatory Visit: Payer: Self-pay | Admitting: Urology

## 2023-03-04 DIAGNOSIS — C61 Malignant neoplasm of prostate: Secondary | ICD-10-CM

## 2023-03-04 LAB — PSA: Prostate Specific Ag, Serum: 0.4 ng/mL (ref 0.0–4.0)

## 2023-03-05 ENCOUNTER — Encounter: Payer: Self-pay | Admitting: Radiation Oncology

## 2023-03-05 ENCOUNTER — Ambulatory Visit
Admission: RE | Admit: 2023-03-05 | Discharge: 2023-03-05 | Disposition: A | Discharge status: Home / Self Care | Payer: 59 | Source: Ambulatory Visit | Attending: Radiation Oncology | Admitting: Radiation Oncology

## 2023-03-05 VITALS — BP 122/78 | HR 102 | Temp 97.3°F | Resp 16 | Ht 71.0 in | Wt 168.2 lb

## 2023-03-05 DIAGNOSIS — Z79899 Other long term (current) drug therapy: Secondary | ICD-10-CM | POA: Insufficient documentation

## 2023-03-05 DIAGNOSIS — Z800 Family history of malignant neoplasm of digestive organs: Secondary | ICD-10-CM | POA: Insufficient documentation

## 2023-03-05 DIAGNOSIS — Z191 Hormone sensitive malignancy status: Secondary | ICD-10-CM | POA: Diagnosis not present

## 2023-03-05 DIAGNOSIS — Z7984 Long term (current) use of oral hypoglycemic drugs: Secondary | ICD-10-CM | POA: Diagnosis not present

## 2023-03-05 DIAGNOSIS — E119 Type 2 diabetes mellitus without complications: Secondary | ICD-10-CM | POA: Diagnosis not present

## 2023-03-05 DIAGNOSIS — C61 Malignant neoplasm of prostate: Secondary | ICD-10-CM | POA: Diagnosis not present

## 2023-03-05 DIAGNOSIS — N393 Stress incontinence (female) (male): Secondary | ICD-10-CM | POA: Diagnosis not present

## 2023-03-05 DIAGNOSIS — Z860100 Personal history of colon polyps, unspecified: Secondary | ICD-10-CM | POA: Insufficient documentation

## 2023-03-05 NOTE — Telephone Encounter (Signed)
Eligard PA form faxed to [facility] for approval, awaiting response.

## 2023-03-06 NOTE — Consult Note (Signed)
NEW PATIENT EVALUATION  Name: Jack Marshall  MRN: [id number]  Date:   03/05/2023     DOB: 05/09/1960   This 63 y.o. 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   PREVIOUS INVESTIGATIONS:  Pathology reports reviewed MRI scan reviewed PSMA PET scan ordered Clinical notes reviewed  HPI: Patient is a 63 year old male presented with an elevated PSA of approximately 4.9.  MRI scan was performed showing a PI-RADS category 5 lesion in the right peripheral zone and information was sent for targeting UroNav biopsy.  Biopsy was positive for Gleason 8 adenocarcinoma (4+4).  MRI did not show any evidence of bone metastasis or pelvic lymphadenopathy.  He underwent radical prostatectomy.  Gleason score was 9 (4+5).  There was no extraprostatic extension all margins were negative all lymph node samples were negative.  Postoperatively has had some stress incontinence still wears a pad.  His PSA postoperatively has remained elevated at 0.3.  He is otherwise fairly asymptomatic.  He is seen today for consideration of salvage radiation therapy.  PLANNED TREATMENT REGIMEN: Salvage radiation therapy  PAST MEDICAL HISTORY:  has a past medical history of Colon polyp, Decreased vision of right eye, Diabetes mellitus without complication (HCC), Family history of colon cancer, Hyperlipidemia, Lack of exercise, Overweight, Prostate cancer (HCC), Seasonal allergies, Sleep apnea, Testicular mass, and Ventral hernia.    PAST SURGICAL HISTORY:  Past Surgical History:  Procedure Laterality Date   COLONOSCOPY     normal colon   COLONOSCOPY W/ POLYPECTOMY  12/09/2005   9 mm TA in ascending  colon   COLONOSCOPY WITH PROPOFOL N/A 09/23/2018   Procedure: COLONOSCOPY WITH PROPOFOL;  Surgeon: Toledo, Boykin Nearing, MD;  Location: [facility] ENDOSCOPY;  Service: Gastroenterology;  Laterality: N/A;   EYE SURGERY     PELVIC LYMPH NODE DISSECTION Bilateral 01/13/2023   Procedure: PELVIC LYMPH NODE DISSECTION;  Surgeon: Sondra Come, MD;  Location: [facility] ORS;  Service: Urology;  Laterality: Bilateral;   ROBOT ASSISTED LAPAROSCOPIC RADICAL PROSTATECTOMY N/A 01/13/2023   Procedure: XI ROBOTIC ASSISTED LAPAROSCOPIC RADICAL PROSTATECTOMY;  Surgeon: Sondra Come, MD;  Location: [facility] ORS;  Service: Urology;  Laterality: N/A;   TESTICULAR EXPLORATION      FAMILY HISTORY: family history includes Colon cancer in his brother and father; Diabetes in his mother; Prostate cancer in his father.  SOCIAL HISTORY:  reports that he has never smoked. He has never been exposed to tobacco smoke. He has never used smokeless tobacco. He reports current alcohol use. He reports that he does not currently use drugs.  ALLERGIES: Patient has no known allergies.  MEDICATIONS:  Current Outpatient Medications  Medication Sig Dispense Refill   Ascorbic Acid (VITAMIN C PO) Take 1 tablet by mouth daily.     atorvastatin (LIPITOR) 10 MG tablet Take 1 tablet (10 mg total) by mouth daily. 90 tablet 3   b complex vitamins capsule Take 1 capsule by mouth daily.     Continuous Glucose Sensor (FREESTYLE LIBRE 3 PLUS SENSOR) MISC Change sensor every 15 days. 2 each 6   metFORMIN (GLUCOPHAGE) 1000 MG tablet Take 1 tablet (1,000 mg total) by mouth 2 (two) times daily with a meal. 180  tablet 3   Multiple Vitamin (MULTIVITAMIN) capsule Take 1 capsule by mouth daily.     No current facility-administered medications for this encounter.    ECOG PERFORMANCE STATUS:  1 - Symptomatic but completely ambulatory  REVIEW OF SYSTEMS: Patient denies any weight loss, fatigue, weakness, fever, chills or night sweats. Patient denies any loss of  vision, blurred vision. Patient denies any ringing  of the ears or hearing loss. No irregular heartbeat. Patient denies heart murmur or history of fainting. Patient denies any chest pain or pain radiating to her upper extremities. Patient denies any shortness of breath, difficulty breathing at night, cough or hemoptysis. Patient denies any swelling in the lower legs. Patient denies any nausea vomiting, vomiting of blood, or coffee ground material in the vomitus. Patient denies any stomach pain. Patient states has had normal bowel movements no significant constipation or diarrhea. Patient denies any dysuria, hematuria or significant nocturia. Patient denies any problems walking, swelling in the joints or loss of balance. Patient denies any skin changes, loss of hair or loss of weight. Patient denies any excessive worrying or anxiety or significant depression. Patient denies any problems with insomnia. Patient denies excessive thirst, polyuria, polydipsia. Patient denies any swollen glands, patient denies easy bruising or easy bleeding. Patient denies any recent infections, allergies or URI. Patient "s visual fields have not changed significantly in recent time.   PHYSICAL EXAM: BP 122/78   Pulse (!) 102   Temp (!) 97.3 F (36.3 C) (Temporal)   Resp 16   Ht 5\' 11"  (1.803 m) Comment: stated HT  Wt 168 lb 3.2 oz (76.3 kg)   BMI 23.46 kg/m  Well-developed well-nourished patient in NAD. HEENT reveals PERLA, EOMI, discs not visualized.  Oral cavity is clear. No oral mucosal lesions are identified. Neck is clear without evidence of cervical or supraclavicular adenopathy. Lungs are clear to A&P. Cardiac examination is essentially unremarkable with regular rate and rhythm without murmur rub or thrill. Abdomen is benign with no organomegaly or masses noted. Motor sensory and DTR levels are equal and symmetric in the upper and lower extremities. Cranial nerves II through XII are grossly intact. Proprioception is  intact. No peripheral adenopathy or edema is identified. No motor or sensory levels are noted. Crude visual fields are within normal range.  LABORATORY DATA: Pathology reports reviewed    RADIOLOGY RESULTS: MRI scan reviewed PSMA PET scan ordered   IMPRESSION: Stage IIIc Gleason 9 (4+5) adenocarcinoma the prostate status post robotic assisted prostatectomy with persistent elevated PSA in 63 year old male  PLAN: At this time I have ordered a PSMA PET scan just to make sure were not dealing with any distant metastatic disease or there are other nodes in his pelvis or prostatic fossa showing increased activity which would further direct my radiation therapy treatments.  I would recommend salvage radiation therapy should that scan be normal up to 70 Gray to his prostatic fossa and pelvic lymph nodes.  I would treat his pelvic lymph nodes based on the high likelihood of possible microscopic involvement based on his tumor factors including Gleason 9 score based on the [facility] nomogram.  Risks and benefits of treatment occluding increased lower urinary tract symptoms diarrhea fatigue alteration blood counts skin reaction all were reviewed with the patient and his wife.  They both comprehend my treatment plan well.  I will meet with them 1 more time after his PSA i PET scan is complete.  I would like to take this opportunity to thank  you for allowing me to participate in the care of your patient.Carmina Miller, MD

## 2023-03-10 ENCOUNTER — Encounter
Admission: RE | Admit: 2023-03-10 | Discharge: 2023-03-10 | Disposition: A | Discharge status: Home / Self Care | Payer: 59 | Source: Ambulatory Visit | Attending: Urology | Admitting: Urology

## 2023-03-10 DIAGNOSIS — C61 Malignant neoplasm of prostate: Secondary | ICD-10-CM | POA: Insufficient documentation

## 2023-03-10 MED ORDER — FLOTUFOLASTAT F 18 GALLIUM 296-5846 MBQ/ML IV SOLN
8.0000 | Freq: Once | INTRAVENOUS | Status: AC
Start: 2023-03-10 — End: 2023-03-10
  Administered 2023-03-10: 8.56 via INTRAVENOUS
  Filled 2023-03-10: qty 8

## 2023-03-10 NOTE — Telephone Encounter (Signed)
Incoming approval of prior auth for Eligard  Dates: 03/07/23 - 02/2324  Auth #[id number]  Pt scheduled

## 2023-03-13 ENCOUNTER — Encounter: Payer: Self-pay | Admitting: Physician Assistant

## 2023-03-13 ENCOUNTER — Ambulatory Visit (INDEPENDENT_AMBULATORY_CARE_PROVIDER_SITE_OTHER): Payer: 59 | Admitting: Physician Assistant

## 2023-03-13 VITALS — BP 121/73 | HR 114 | Ht 71.0 in | Wt 168.0 lb

## 2023-03-13 DIAGNOSIS — C61 Malignant neoplasm of prostate: Secondary | ICD-10-CM | POA: Diagnosis not present

## 2023-03-13 MED ORDER — LEUPROLIDE ACETATE (6 MONTH) 45 MG ~~LOC~~ KIT
45.0000 mg | PACK | Freq: Once | SUBCUTANEOUS | Status: AC
  Administered 2023-03-13: 45 mg via SUBCUTANEOUS

## 2023-03-13 NOTE — Progress Notes (Signed)
Patient presented to clinic today for initiation of 6 months of ADT with Eligard per Drs. Sninsky and Chrystal.   Patient has a history of DM2, HLD.  We discussed the anticipated side effects of ADT today including hot flashes, decreased libido, erectile dysfunction, breast tenderness or size changes, fatigue, weight gain, bone loss, muscle mass loss, brain fog, increased cholesterol, increased blood pressure, increased blood sugar, and increased risk for stroke and heart attack.  I counseled him to maintain a healthy diet and continue regular exercise including weightbearing exercise to mitigate bone and muscle loss for the duration of therapy.  Additionally, I counseled him to start daily calcium 1000-1200mg  and vitamin D 800-1000IU supplements to reduce bone loss.  Lastly, I encouraged him to maintain regular follow-ups with his PCP for monitoring of his DM2, HLD. He expressed understanding, all questions answered. Printed resources on ADT provided today.  Carman Ching, PA-C 03/13/23 2:34 PM  I spent 20 minutes on the day of the encounter to include pre-visit record review, face-to-face time with the patient, and post-visit ordering of tests.

## 2023-03-13 NOTE — Progress Notes (Signed)
Eligard SubQ Injection   Due to Prostate Cancer patient is present today for a Eligard Injection.  Medication: Eligard 6 month Dose: 45 mg  Location: right  Lot: [id number] Exp: 07/2024  Patient tolerated well, no complications were noted  Performed by: S.Viveka Wilmeth

## 2023-03-13 NOTE — Patient Instructions (Signed)
Please take the following dietary supplements for the duration of your hormone suppression therapy to reduce your risk for bone loss: -Calcium 1000-1200mg  daily -Vitamin D 800-1000IU daily

## 2023-03-17 ENCOUNTER — Encounter: Payer: Self-pay | Admitting: Radiation Oncology

## 2023-03-17 ENCOUNTER — Ambulatory Visit
Admission: RE | Admit: 2023-03-17 | Discharge: 2023-03-17 | Disposition: A | Discharge status: Home / Self Care | Payer: 59 | Source: Ambulatory Visit | Attending: Radiation Oncology | Admitting: Radiation Oncology

## 2023-03-17 VITALS — BP 116/76 | HR 116 | Temp 97.6°F | Resp 16

## 2023-03-17 DIAGNOSIS — Z191 Hormone sensitive malignancy status: Secondary | ICD-10-CM | POA: Diagnosis not present

## 2023-03-17 DIAGNOSIS — Z510 Encounter for antineoplastic radiation therapy: Secondary | ICD-10-CM | POA: Diagnosis not present

## 2023-03-17 DIAGNOSIS — C61 Malignant neoplasm of prostate: Secondary | ICD-10-CM | POA: Insufficient documentation

## 2023-03-17 NOTE — Progress Notes (Signed)
Radiation Oncology Follow up Note  Name: Jack Marshall   Date:   03/17/2023 MRN:  [id number] DOB: Apr 11, 1960    This 63 y.o. 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  REFERRING PROVIDER: Joni Reining, PA-C  HPI: Patient is a 63 year old male who was seen in consultation for rising PSA after robotic assisted prostatectomy for Gleason 9 (4+5) adenocarcinoma presenting with a PSA in the 5 range.  He is still having urinary incontinence and does wear a pad.  We ran a PSMA PET scan on him.  Which fortunately showed no evidence of local prostate cancer recurrence no evidence of metastatic adenopathy in the pelvis or periotic retroperitoneum or evidence of visceral mets or skeletal mets.  He is otherwise doing well at this time.  He had a PSA 2 weeks ago which was 0.4 up from 0.33 weeks ago  COMPLICATIONS OF TREATMENT: none  FOLLOW UP COMPLIANCE: keeps appointments   PHYSICAL EXAM:  BP 116/76   Pulse (!) 116   Temp 97.6 F (36.4 C) (Tympanic)   Resp 16  Well-developed well-nourished patient in NAD. HEENT reveals PERLA, EOMI, discs not visualized.  Oral cavity is clear. No oral mucosal lesions are identified. Neck is clear without evidence of cervical or supraclavicular adenopathy. Lungs are clear to A&P. Cardiac examination is essentially unremarkable with regular rate and rhythm without murmur rub or thrill. Abdomen is benign with no organomegaly or masses noted. Motor sensory and DTR levels are equal and symmetric in the upper and lower extremities. Cranial nerves II through XII are grossly intact. Proprioception is intact. No peripheral adenopathy or edema is identified. No motor or sensory levels are noted. Crude visual fields are within normal range.  RADIOLOGY RESULTS: PSMA PET  scan reviewed compatible with above-stated findings  PLAN: At this time like to go ahead with salvage radiation therapy to his prostatic fossa as well as pelvic lymph nodes based on his high Gleason score of 9.  Would plan on delivering 49 Gray over 7 weeks and dose pain to his pelvic lymph nodes to 54 Gray using IMRT treatment planning and delivery.  Risks and benefits of treatment including increased lower urinary tract symptoms diarrhea fatigue alteration of blood counts skin reaction all were discussed in detail with the patient.  He comprehends my recommendations well.  I have personally set up and ordered CT simulation for later this week.  I would like to take this opportunity to thank you for allowing me to participate in the care of your patient.Carmina Miller, MD

## 2023-03-18 ENCOUNTER — Encounter (INDEPENDENT_AMBULATORY_CARE_PROVIDER_SITE_OTHER): Payer: 59 | Admitting: Internal Medicine

## 2023-03-18 DIAGNOSIS — C61 Malignant neoplasm of prostate: Secondary | ICD-10-CM | POA: Diagnosis not present

## 2023-03-18 DIAGNOSIS — G4733 Obstructive sleep apnea (adult) (pediatric): Secondary | ICD-10-CM

## 2023-03-18 DIAGNOSIS — Z191 Hormone sensitive malignancy status: Secondary | ICD-10-CM | POA: Diagnosis not present

## 2023-03-20 ENCOUNTER — Ambulatory Visit
Admission: RE | Admit: 2023-03-20 | Discharge: 2023-03-20 | Disposition: A | Discharge status: Home / Self Care | Payer: 59 | Source: Ambulatory Visit | Attending: Radiation Oncology | Admitting: Radiation Oncology

## 2023-03-20 DIAGNOSIS — Z191 Hormone sensitive malignancy status: Secondary | ICD-10-CM | POA: Diagnosis not present

## 2023-03-20 DIAGNOSIS — C61 Malignant neoplasm of prostate: Secondary | ICD-10-CM | POA: Diagnosis not present

## 2023-03-20 DIAGNOSIS — Z510 Encounter for antineoplastic radiation therapy: Secondary | ICD-10-CM | POA: Diagnosis not present

## 2023-03-21 DIAGNOSIS — Z191 Hormone sensitive malignancy status: Secondary | ICD-10-CM | POA: Diagnosis not present

## 2023-03-21 DIAGNOSIS — Z510 Encounter for antineoplastic radiation therapy: Secondary | ICD-10-CM | POA: Diagnosis not present

## 2023-03-21 DIAGNOSIS — C61 Malignant neoplasm of prostate: Secondary | ICD-10-CM | POA: Diagnosis not present

## 2023-03-25 NOTE — Procedures (Signed)
 [facility]  Polysomnogram Report Part I                                                               Phone: [phone] Fax: [phone]  Patient Name: Jack Marshall, Jack Marshall. Acquisition Number: [id number]  Date of Birth: 1960-09-19 Acquisition Date: 03/18/2023  Referring Physician: Tanda Sharps, PA-C     History: The patient is a 63 year old  who was referred for re-evaluation of obstructive sleep apnea. Medical History: type 2 diabetes mellitus, hyperlipidemia, overweight, seasonal allergies and sleep apnea.  Medications: atorvastatin , celecoxib , metformin , tadalafil  and multivitamin.  Procedure: This routine overnight polysomnogram was performed on the [facility] 5 using the standard diagnostic protocol. This included 6 channels of EEG, 2 channels of EOG, chin EMG, bilateral anterior tibialis EMG, nasal/oral thermistor, PTAF (nasal pressure transducer), chest and abdominal wall movements, EKG, and pulse oximetry.  Description: The total recording time was 399.6 minutes. The total sleep time was 273.5 minutes. There were a total of 114.4 minutes of wakefulness after sleep onset for areducedsleep efficiency of 68.4%. The latency to sleep onset was within normal limitsat 11.7 minutes. The R sleep onset latency was prolonged at 142.0 minutes. Sleep parameters, as a percentage of the total sleep time, demonstrated 32.9% of sleep was in N1 sleep, 46.1% N2, 0.9% N3 and 20.1% R sleep. There were a total of 400 arousals for an arousal index of 87.8 arousals per hour of sleep that was elevated.  Respiratory monitoring demonstrated   snoring. Less than 19 minutes of non-supine sleep were observed. There were 320 apneas and hypopneas for an Apnea Hypopnea Index of 70.2 apneas and hypopneas per hour of sleep. The REM related apnea hypopnea index was 79.6/hr of REM sleep compared to a NREM AHI of 67.8/hr. The Respiratory Disturbance Index, which includes 22 respiratory effort related arousals (RERAs), was  75.0 respiratory events per hour of sleep.  The average duration of the respiratory events was 24.3 seconds with a maximum duration of 54.5 seconds. The respiratory events occurred only in the supine position. The respiratory events were associated with peripheral oxygen desaturations on the average to 85%. The lowest oxygen desaturation associated with a respiratory event was 59%. Additionally, the baseline oxygen saturation during wakefulness was 95%, during NREM sleep averaged 94%, and during REM sleep averaged 89%. The total duration of oxygen < 90% was 50.3 minutes and <80% was 8.4 minutes.  Cardiac monitoring-  demonstrate transient cardiac decelerations associated with the apneas.  significant cardiac rhythm irregularities.   Periodic limb movement monitoring- did not demonstrate periodic limb movements.    Impression: This routine overnight polysomnogram confirmed the presence of very severe obstructive sleep apnea with an overall Apnea Hypopnea Index of 70.2 apneas and hypopneas per hour of sleep with the lowest desaturation to 59%.  Almost all sleep was in the supine position.   reduced sleep efficiency  with anelevated arousal index,increased awakenings, and a reduced percentage of slow wave sleep.  These findings would appear to be due to the obstructive sleep apnea.  Recommendations:    A CPAP titration would be recommended due to the severity of the sleep apnea. Some supine sleep should be ensured to optimize the titration.    Elfreda RONAL Bathe, MD, [facility] Diplomate ABMS-Pulmonary,  Critical Care and Sleep Medicine  Electronically reviewed and digitally signed  [facility] Polysomnogram Report Part II  Phone: [phone] Fax: [phone]  Patient last name [facility] [place] Neck Size    in. Acquisition [id number]  Patient first name Jack A. Weight 166.0 lbs. Started 03/18/2023 at 10:55:46 PM  Birth date [birth year] Height 71.0 in. Stopped 03/19/2023 at 6:35:34 AM  Age 59 BMI  23.1 lb/in2 Duration 399.6  Study Type Adult      Report generated by Selinda Capron, RPSGT  Reviewed by: Kathe G. Henke, PhD, ABSM, FAASM Sleep Data: Lights Out: 11:05:04 PM Sleep Onset: 11:16:46 PM  Lights On: 5:44:40 AM Sleep Efficiency: 68.4 %  Total Recording Time: 399.6 min Sleep Latency (from Lights Off) 11.7 min  Total Sleep Time (TST): 273.5 min R Latency (from Sleep Onset): 142.0 min  Sleep Period Time: 387.5 min Total number of awakenings: 44  Wake during sleep: 114.0 min Wake After Sleep Onset (WASO): 114.4 min   Sleep Data:         Arousal Summary: Stage  Latency from lights out (min) Latency from sleep onset (min) Duration (min) % Total Sleep Time  Normal values  N 1 11.7 0.0 90.0 32.9 (5%)  N 2 12.2 0.5 126.0 46.1 (50%)  N 3 37.7 26.0 2.5 0.9 (20%)  R 153.7 142.0 55.0 20.1 (25%)   Number Index  Spontaneous 94 20.6  Apneas & Hypopneas 303 66.5  RERAs 22 4.8       (Apneas & Hypopneas & RERAs)  (325) (71.3)  Limb Movement 0 0.0  Snore 0 0.0  TOTAL 419 91.9     Respiratory Data:  CA OA MA Apnea Hypopnea* A+ H RERA Total  Number 36 195 1 232 88 320 22 342  Mean Dur (sec) 19.5 25.4 33.5 24.5 25.1 24.7 18.4 24.3  Max Dur (sec) 33.0 48.0 33.5 48.0 54.5 54.5 26.5 54.5  Total Dur (min) 11.7 82.4 0.6 94.6 36.9 131.5 6.7 138.3  % of TST 4.3 30.1 0.2 34.6 13.5 48.1 2.5 50.6  Index (#/h TST) 7.9 42.8 0.2 50.9 19.3 70.2 4.8 75.0  *Hypopneas scored based on 4% or greater desaturation.  Sleep Stage:        REM NREM TST  AHI 79.6 67.8 70.2  RDI 79.6 73.9 75.0           Body Position Data:  Sleep (min) TST (%) REM (min) NREM (min) CA (#) OA (#) MA (#) HYP (#) AHI (#/h) RERA (#) RDI (#/h) Desat (#)  Supine 255.3 93.35 55.0 200.3 36 195 1 88 75.2 14 78.5 328  Non-Supine 18.20 6.65 0.00 18.20 0.00 0.00 0.00 0.00 0.00 8.00 26.37 1.00  Left: 18.2 6.65 0.0 18.2 0 0 0 0 0.0 8 26.4 1     Snoring: Total number of snoring episodes  0  Total time with snoring     min (   % of sleep)   Oximetry Distribution:             WK REM NREM TOTAL  Average (%)   95 89 94 93  < 90% 3.1 23.0 24.2 50.3  < 80% 0.4 7.0 1.0 8.4  < 70% 0.0 1.2 0.0 1.2  # of Desaturations* 9 80 231 320  Desat Index (#/hour) 4.9 87.3 63.4 70.2  Desat Max (%) 15 32 29 32  Desat Max Dur (sec) 56.0 90.0 74.0 90.0  Approx Min O2 during sleep 59  Approx min O2 during  a respiratory event 59  Was Oxygen added (Y/N) and final rate :    LPM  *Desaturations based on 4% or greater drop from baseline.   Cheyne Stokes Breathing: None Present   Heart Rate Summary:  Average Heart Rate During Sleep 53.4 bpm      Highest Heart Rate During Sleep (95th %) 255.0 bpm (artifact)  Highest Heart Rate During Sleep 255 bpm (artifact)  Highest Heart Rate During Recording (TIB) 255 bpm (artifact)   Heart Rate Observations: Event Type # Events   Bradycardia 0 Lowest HR Scored: N/A  Sinus Tachycardia During Sleep 0 Highest HR Scored: N/A  Narrow Complex Tachycardia 0 Highest HR Scored: N/A  Wide Complex Tachycardia 0 Highest HR Scored: N/A  Asystole 0 Longest Pause: N/A  Atrial Fibrillation 0 Duration Longest Event: N/A  Other Arrythmias   Type:    Periodic Limb Movement Data: (Primary legs unless otherwise noted) Total # Limb Movement 0 Limb Movement Index 0.0  Total # PLMS    PLMS Index     Total # PLMS Arousals    PLMS Arousal Index     Percentage Sleep Time with PLMS   min (   % sleep)  Mean Duration limb movements (secs)

## 2023-03-27 ENCOUNTER — Other Ambulatory Visit: Payer: Self-pay | Admitting: *Deleted

## 2023-03-27 DIAGNOSIS — C61 Malignant neoplasm of prostate: Secondary | ICD-10-CM

## 2023-03-31 ENCOUNTER — Ambulatory Visit
Admission: RE | Admit: 2023-03-31 | Discharge: 2023-03-31 | Disposition: A | Discharge status: Home / Self Care | Payer: 59 | Source: Ambulatory Visit | Attending: Radiation Oncology | Admitting: Radiation Oncology

## 2023-04-01 ENCOUNTER — Ambulatory Visit
Admission: RE | Admit: 2023-04-01 | Discharge: 2023-04-01 | Disposition: A | Discharge status: Home / Self Care | Payer: 59 | Source: Ambulatory Visit | Attending: Radiation Oncology | Admitting: Radiation Oncology

## 2023-04-01 ENCOUNTER — Other Ambulatory Visit: Payer: Self-pay

## 2023-04-01 DIAGNOSIS — C61 Malignant neoplasm of prostate: Secondary | ICD-10-CM | POA: Diagnosis not present

## 2023-04-01 DIAGNOSIS — Z191 Hormone sensitive malignancy status: Secondary | ICD-10-CM | POA: Diagnosis not present

## 2023-04-01 DIAGNOSIS — Z510 Encounter for antineoplastic radiation therapy: Secondary | ICD-10-CM | POA: Diagnosis not present

## 2023-04-01 LAB — RAD ONC ARIA SESSION SUMMARY
Course Elapsed Days: 0
Plan Fractions Treated to Date: 1
Plan Prescribed Dose Per Fraction: 2 Gy
Plan Total Fractions Prescribed: 38
Plan Total Prescribed Dose: 76 Gy
Reference Point Dosage Given to Date: 2 Gy
Reference Point Session Dosage Given: 2 Gy
Session Number: 1

## 2023-04-02 ENCOUNTER — Other Ambulatory Visit: Payer: Self-pay

## 2023-04-02 ENCOUNTER — Ambulatory Visit
Admission: RE | Admit: 2023-04-02 | Discharge: 2023-04-02 | Disposition: A | Discharge status: Home / Self Care | Payer: 59 | Source: Ambulatory Visit | Attending: Radiation Oncology | Admitting: Radiation Oncology

## 2023-04-02 DIAGNOSIS — C61 Malignant neoplasm of prostate: Secondary | ICD-10-CM | POA: Diagnosis not present

## 2023-04-02 DIAGNOSIS — Z510 Encounter for antineoplastic radiation therapy: Secondary | ICD-10-CM | POA: Diagnosis not present

## 2023-04-02 DIAGNOSIS — Z191 Hormone sensitive malignancy status: Secondary | ICD-10-CM | POA: Diagnosis not present

## 2023-04-02 LAB — RAD ONC ARIA SESSION SUMMARY
Course Elapsed Days: 1
Plan Fractions Treated to Date: 2
Plan Prescribed Dose Per Fraction: 2 Gy
Plan Total Fractions Prescribed: 38
Plan Total Prescribed Dose: 76 Gy
Reference Point Dosage Given to Date: 4 Gy
Reference Point Session Dosage Given: 2 Gy
Session Number: 2

## 2023-04-03 ENCOUNTER — Ambulatory Visit
Admission: RE | Admit: 2023-04-03 | Discharge: 2023-04-03 | Disposition: A | Discharge status: Home / Self Care | Payer: 59 | Source: Ambulatory Visit | Attending: Radiation Oncology | Admitting: Radiation Oncology

## 2023-04-03 ENCOUNTER — Other Ambulatory Visit: Payer: Self-pay

## 2023-04-03 DIAGNOSIS — C61 Malignant neoplasm of prostate: Secondary | ICD-10-CM | POA: Diagnosis not present

## 2023-04-03 DIAGNOSIS — Z191 Hormone sensitive malignancy status: Secondary | ICD-10-CM | POA: Diagnosis not present

## 2023-04-03 DIAGNOSIS — Z510 Encounter for antineoplastic radiation therapy: Secondary | ICD-10-CM | POA: Diagnosis not present

## 2023-04-03 LAB — RAD ONC ARIA SESSION SUMMARY
Course Elapsed Days: 2
Plan Fractions Treated to Date: 3
Plan Prescribed Dose Per Fraction: 2 Gy
Plan Total Fractions Prescribed: 38
Plan Total Prescribed Dose: 76 Gy
Reference Point Dosage Given to Date: 6 Gy
Reference Point Session Dosage Given: 2 Gy
Session Number: 3

## 2023-04-04 ENCOUNTER — Other Ambulatory Visit: Payer: Self-pay

## 2023-04-04 ENCOUNTER — Ambulatory Visit
Admission: RE | Admit: 2023-04-04 | Discharge: 2023-04-04 | Disposition: A | Discharge status: Home / Self Care | Payer: 59 | Source: Ambulatory Visit | Attending: Radiation Oncology | Admitting: Radiation Oncology

## 2023-04-04 DIAGNOSIS — Z510 Encounter for antineoplastic radiation therapy: Secondary | ICD-10-CM | POA: Diagnosis not present

## 2023-04-04 DIAGNOSIS — Z191 Hormone sensitive malignancy status: Secondary | ICD-10-CM | POA: Diagnosis not present

## 2023-04-04 DIAGNOSIS — C61 Malignant neoplasm of prostate: Secondary | ICD-10-CM | POA: Diagnosis not present

## 2023-04-04 LAB — RAD ONC ARIA SESSION SUMMARY
Course Elapsed Days: 3
Plan Fractions Treated to Date: 4
Plan Prescribed Dose Per Fraction: 2 Gy
Plan Total Fractions Prescribed: 38
Plan Total Prescribed Dose: 76 Gy
Reference Point Dosage Given to Date: 8 Gy
Reference Point Session Dosage Given: 2 Gy
Session Number: 4

## 2023-04-07 ENCOUNTER — Inpatient Hospital Stay: Payer: 59

## 2023-04-07 ENCOUNTER — Ambulatory Visit
Admission: RE | Admit: 2023-04-07 | Discharge: 2023-04-07 | Disposition: A | Discharge status: Home / Self Care | Payer: 59 | Source: Ambulatory Visit | Attending: Radiation Oncology | Admitting: Radiation Oncology

## 2023-04-07 ENCOUNTER — Other Ambulatory Visit: Payer: Self-pay

## 2023-04-07 DIAGNOSIS — Z191 Hormone sensitive malignancy status: Secondary | ICD-10-CM | POA: Diagnosis not present

## 2023-04-07 DIAGNOSIS — C61 Malignant neoplasm of prostate: Secondary | ICD-10-CM | POA: Insufficient documentation

## 2023-04-07 DIAGNOSIS — Z510 Encounter for antineoplastic radiation therapy: Secondary | ICD-10-CM | POA: Diagnosis not present

## 2023-04-07 LAB — RAD ONC ARIA SESSION SUMMARY
Course Elapsed Days: 6
Plan Fractions Treated to Date: 5
Plan Prescribed Dose Per Fraction: 2 Gy
Plan Total Fractions Prescribed: 38
Plan Total Prescribed Dose: 76 Gy
Reference Point Dosage Given to Date: 10 Gy
Reference Point Session Dosage Given: 2 Gy
Session Number: 5

## 2023-04-07 LAB — CBC (CANCER CENTER ONLY)
HCT: 38.7 % — ABNORMAL LOW (ref 39.0–52.0)
Hemoglobin: 12.6 g/dL — ABNORMAL LOW (ref 13.0–17.0)
MCH: 30.4 pg (ref 26.0–34.0)
MCHC: 32.6 g/dL (ref 30.0–36.0)
MCV: 93.5 fL (ref 80.0–100.0)
Platelet Count: 249 10*3/uL (ref 150–400)
RBC: 4.14 MIL/uL — ABNORMAL LOW (ref 4.22–5.81)
RDW: 12 % (ref 11.5–15.5)
WBC Count: 5.9 10*3/uL (ref 4.0–10.5)
nRBC: 0 % (ref 0.0–0.2)

## 2023-04-08 ENCOUNTER — Other Ambulatory Visit: Payer: Self-pay

## 2023-04-08 ENCOUNTER — Ambulatory Visit
Admission: RE | Admit: 2023-04-08 | Discharge: 2023-04-08 | Disposition: A | Discharge status: Home / Self Care | Payer: 59 | Source: Ambulatory Visit | Attending: Radiation Oncology | Admitting: Radiation Oncology

## 2023-04-08 DIAGNOSIS — Z510 Encounter for antineoplastic radiation therapy: Secondary | ICD-10-CM | POA: Diagnosis not present

## 2023-04-08 DIAGNOSIS — Z191 Hormone sensitive malignancy status: Secondary | ICD-10-CM | POA: Diagnosis not present

## 2023-04-08 DIAGNOSIS — C61 Malignant neoplasm of prostate: Secondary | ICD-10-CM | POA: Diagnosis not present

## 2023-04-08 LAB — RAD ONC ARIA SESSION SUMMARY
Course Elapsed Days: 7
Plan Fractions Treated to Date: 6
Plan Prescribed Dose Per Fraction: 2 Gy
Plan Total Fractions Prescribed: 38
Plan Total Prescribed Dose: 76 Gy
Reference Point Dosage Given to Date: 12 Gy
Reference Point Session Dosage Given: 2 Gy
Session Number: 6

## 2023-04-09 ENCOUNTER — Other Ambulatory Visit: Payer: Self-pay

## 2023-04-09 ENCOUNTER — Ambulatory Visit
Admission: RE | Admit: 2023-04-09 | Discharge: 2023-04-09 | Disposition: A | Discharge status: Home / Self Care | Payer: 59 | Source: Ambulatory Visit | Attending: Radiation Oncology | Admitting: Radiation Oncology

## 2023-04-09 DIAGNOSIS — C61 Malignant neoplasm of prostate: Secondary | ICD-10-CM | POA: Diagnosis not present

## 2023-04-09 DIAGNOSIS — Z191 Hormone sensitive malignancy status: Secondary | ICD-10-CM | POA: Diagnosis not present

## 2023-04-09 DIAGNOSIS — Z510 Encounter for antineoplastic radiation therapy: Secondary | ICD-10-CM | POA: Diagnosis not present

## 2023-04-09 LAB — RAD ONC ARIA SESSION SUMMARY
Course Elapsed Days: 8
Plan Fractions Treated to Date: 7
Plan Prescribed Dose Per Fraction: 2 Gy
Plan Total Fractions Prescribed: 38
Plan Total Prescribed Dose: 76 Gy
Reference Point Dosage Given to Date: 14 Gy
Reference Point Session Dosage Given: 2 Gy
Session Number: 7

## 2023-04-10 ENCOUNTER — Other Ambulatory Visit: Payer: Self-pay

## 2023-04-10 ENCOUNTER — Ambulatory Visit
Admission: RE | Admit: 2023-04-10 | Discharge: 2023-04-10 | Disposition: A | Discharge status: Home / Self Care | Payer: 59 | Source: Ambulatory Visit | Attending: Radiation Oncology | Admitting: Radiation Oncology

## 2023-04-10 DIAGNOSIS — C61 Malignant neoplasm of prostate: Secondary | ICD-10-CM | POA: Diagnosis not present

## 2023-04-10 DIAGNOSIS — Z191 Hormone sensitive malignancy status: Secondary | ICD-10-CM | POA: Diagnosis not present

## 2023-04-10 DIAGNOSIS — Z510 Encounter for antineoplastic radiation therapy: Secondary | ICD-10-CM | POA: Diagnosis not present

## 2023-04-10 LAB — RAD ONC ARIA SESSION SUMMARY
Course Elapsed Days: 9
Plan Fractions Treated to Date: 8
Plan Prescribed Dose Per Fraction: 2 Gy
Plan Total Fractions Prescribed: 38
Plan Total Prescribed Dose: 76 Gy
Reference Point Dosage Given to Date: 16 Gy
Reference Point Session Dosage Given: 2 Gy
Session Number: 8

## 2023-04-11 ENCOUNTER — Ambulatory Visit
Admission: RE | Admit: 2023-04-11 | Discharge: 2023-04-11 | Disposition: A | Discharge status: Home / Self Care | Payer: 59 | Source: Ambulatory Visit | Attending: Radiation Oncology | Admitting: Radiation Oncology

## 2023-04-11 ENCOUNTER — Other Ambulatory Visit: Payer: Self-pay

## 2023-04-11 DIAGNOSIS — Z510 Encounter for antineoplastic radiation therapy: Secondary | ICD-10-CM | POA: Diagnosis not present

## 2023-04-11 DIAGNOSIS — C61 Malignant neoplasm of prostate: Secondary | ICD-10-CM | POA: Diagnosis not present

## 2023-04-11 DIAGNOSIS — Z191 Hormone sensitive malignancy status: Secondary | ICD-10-CM | POA: Diagnosis not present

## 2023-04-11 LAB — RAD ONC ARIA SESSION SUMMARY
Course Elapsed Days: 10
Plan Fractions Treated to Date: 9
Plan Prescribed Dose Per Fraction: 2 Gy
Plan Total Fractions Prescribed: 38
Plan Total Prescribed Dose: 76 Gy
Reference Point Dosage Given to Date: 18 Gy
Reference Point Session Dosage Given: 2 Gy
Session Number: 9

## 2023-04-14 ENCOUNTER — Ambulatory Visit
Admission: RE | Admit: 2023-04-14 | Discharge: 2023-04-14 | Disposition: A | Discharge status: Home / Self Care | Payer: 59 | Source: Ambulatory Visit | Attending: Radiation Oncology | Admitting: Radiation Oncology

## 2023-04-14 ENCOUNTER — Other Ambulatory Visit: Payer: Self-pay

## 2023-04-14 DIAGNOSIS — C61 Malignant neoplasm of prostate: Secondary | ICD-10-CM | POA: Insufficient documentation

## 2023-04-14 DIAGNOSIS — Z510 Encounter for antineoplastic radiation therapy: Secondary | ICD-10-CM | POA: Insufficient documentation

## 2023-04-14 DIAGNOSIS — Z191 Hormone sensitive malignancy status: Secondary | ICD-10-CM | POA: Diagnosis not present

## 2023-04-14 LAB — RAD ONC ARIA SESSION SUMMARY
Course Elapsed Days: 13
Plan Fractions Treated to Date: 10
Plan Prescribed Dose Per Fraction: 2 Gy
Plan Total Fractions Prescribed: 38
Plan Total Prescribed Dose: 76 Gy
Reference Point Dosage Given to Date: 20 Gy
Reference Point Session Dosage Given: 2 Gy
Session Number: 10

## 2023-04-15 ENCOUNTER — Ambulatory Visit
Admission: RE | Admit: 2023-04-15 | Discharge: 2023-04-15 | Disposition: A | Discharge status: Home / Self Care | Payer: 59 | Source: Ambulatory Visit | Attending: Radiation Oncology | Admitting: Radiation Oncology

## 2023-04-15 ENCOUNTER — Other Ambulatory Visit: Payer: Self-pay

## 2023-04-15 DIAGNOSIS — Z191 Hormone sensitive malignancy status: Secondary | ICD-10-CM | POA: Diagnosis not present

## 2023-04-15 DIAGNOSIS — C61 Malignant neoplasm of prostate: Secondary | ICD-10-CM | POA: Diagnosis not present

## 2023-04-15 DIAGNOSIS — Z510 Encounter for antineoplastic radiation therapy: Secondary | ICD-10-CM | POA: Diagnosis not present

## 2023-04-15 LAB — RAD ONC ARIA SESSION SUMMARY
Course Elapsed Days: 14
Plan Fractions Treated to Date: 11
Plan Prescribed Dose Per Fraction: 2 Gy
Plan Total Fractions Prescribed: 38
Plan Total Prescribed Dose: 76 Gy
Reference Point Dosage Given to Date: 22 Gy
Reference Point Session Dosage Given: 2 Gy
Session Number: 11

## 2023-04-16 ENCOUNTER — Other Ambulatory Visit: Payer: Self-pay

## 2023-04-16 ENCOUNTER — Ambulatory Visit
Admission: RE | Admit: 2023-04-16 | Discharge: 2023-04-16 | Disposition: A | Discharge status: Home / Self Care | Payer: 59 | Source: Ambulatory Visit | Attending: Radiation Oncology | Admitting: Radiation Oncology

## 2023-04-16 DIAGNOSIS — Z510 Encounter for antineoplastic radiation therapy: Secondary | ICD-10-CM | POA: Diagnosis not present

## 2023-04-16 DIAGNOSIS — C61 Malignant neoplasm of prostate: Secondary | ICD-10-CM | POA: Diagnosis not present

## 2023-04-16 DIAGNOSIS — Z191 Hormone sensitive malignancy status: Secondary | ICD-10-CM | POA: Diagnosis not present

## 2023-04-16 LAB — RAD ONC ARIA SESSION SUMMARY
Course Elapsed Days: 15
Plan Fractions Treated to Date: 12
Plan Prescribed Dose Per Fraction: 2 Gy
Plan Total Fractions Prescribed: 38
Plan Total Prescribed Dose: 76 Gy
Reference Point Dosage Given to Date: 24 Gy
Reference Point Session Dosage Given: 2 Gy
Session Number: 12

## 2023-04-17 ENCOUNTER — Other Ambulatory Visit: Payer: Self-pay

## 2023-04-17 ENCOUNTER — Ambulatory Visit
Admission: RE | Admit: 2023-04-17 | Discharge: 2023-04-17 | Disposition: A | Discharge status: Home / Self Care | Payer: 59 | Source: Ambulatory Visit | Attending: Radiation Oncology | Admitting: Radiation Oncology

## 2023-04-17 DIAGNOSIS — Z191 Hormone sensitive malignancy status: Secondary | ICD-10-CM | POA: Diagnosis not present

## 2023-04-17 DIAGNOSIS — Z510 Encounter for antineoplastic radiation therapy: Secondary | ICD-10-CM | POA: Diagnosis not present

## 2023-04-17 DIAGNOSIS — C61 Malignant neoplasm of prostate: Secondary | ICD-10-CM | POA: Diagnosis not present

## 2023-04-17 LAB — RAD ONC ARIA SESSION SUMMARY
Course Elapsed Days: 16
Plan Fractions Treated to Date: 13
Plan Prescribed Dose Per Fraction: 2 Gy
Plan Total Fractions Prescribed: 38
Plan Total Prescribed Dose: 76 Gy
Reference Point Dosage Given to Date: 26 Gy
Reference Point Session Dosage Given: 2 Gy
Session Number: 13

## 2023-04-18 ENCOUNTER — Ambulatory Visit
Admission: RE | Admit: 2023-04-18 | Discharge: 2023-04-18 | Disposition: A | Discharge status: Home / Self Care | Payer: 59 | Source: Ambulatory Visit | Attending: Radiation Oncology | Admitting: Radiation Oncology

## 2023-04-18 ENCOUNTER — Other Ambulatory Visit: Payer: Self-pay

## 2023-04-18 DIAGNOSIS — Z510 Encounter for antineoplastic radiation therapy: Secondary | ICD-10-CM | POA: Diagnosis not present

## 2023-04-18 DIAGNOSIS — Z191 Hormone sensitive malignancy status: Secondary | ICD-10-CM | POA: Diagnosis not present

## 2023-04-18 DIAGNOSIS — C61 Malignant neoplasm of prostate: Secondary | ICD-10-CM | POA: Diagnosis not present

## 2023-04-18 LAB — RAD ONC ARIA SESSION SUMMARY
Course Elapsed Days: 17
Plan Fractions Treated to Date: 14
Plan Prescribed Dose Per Fraction: 2 Gy
Plan Total Fractions Prescribed: 38
Plan Total Prescribed Dose: 76 Gy
Reference Point Dosage Given to Date: 28 Gy
Reference Point Session Dosage Given: 2 Gy
Session Number: 14

## 2023-04-21 ENCOUNTER — Inpatient Hospital Stay: Payer: 59 | Attending: Radiation Oncology

## 2023-04-21 ENCOUNTER — Other Ambulatory Visit: Payer: Self-pay

## 2023-04-21 ENCOUNTER — Ambulatory Visit
Admission: RE | Admit: 2023-04-21 | Discharge: 2023-04-21 | Disposition: A | Discharge status: Home / Self Care | Payer: 59 | Source: Ambulatory Visit | Attending: Radiation Oncology | Admitting: Radiation Oncology

## 2023-04-21 DIAGNOSIS — C61 Malignant neoplasm of prostate: Secondary | ICD-10-CM

## 2023-04-21 DIAGNOSIS — Z191 Hormone sensitive malignancy status: Secondary | ICD-10-CM | POA: Diagnosis not present

## 2023-04-21 DIAGNOSIS — Z510 Encounter for antineoplastic radiation therapy: Secondary | ICD-10-CM | POA: Diagnosis not present

## 2023-04-21 LAB — CBC (CANCER CENTER ONLY)
HCT: 36.9 % — ABNORMAL LOW (ref 39.0–52.0)
Hemoglobin: 12.2 g/dL — ABNORMAL LOW (ref 13.0–17.0)
MCH: 30.3 pg (ref 26.0–34.0)
MCHC: 33.1 g/dL (ref 30.0–36.0)
MCV: 91.8 fL (ref 80.0–100.0)
Platelet Count: 165 10*3/uL (ref 150–400)
RBC: 4.02 MIL/uL — ABNORMAL LOW (ref 4.22–5.81)
RDW: 12 % (ref 11.5–15.5)
WBC Count: 4.9 10*3/uL (ref 4.0–10.5)
nRBC: 0 % (ref 0.0–0.2)

## 2023-04-21 LAB — RAD ONC ARIA SESSION SUMMARY
Course Elapsed Days: 20
Plan Fractions Treated to Date: 15
Plan Prescribed Dose Per Fraction: 2 Gy
Plan Total Fractions Prescribed: 38
Plan Total Prescribed Dose: 76 Gy
Reference Point Dosage Given to Date: 30 Gy
Reference Point Session Dosage Given: 2 Gy
Session Number: 15

## 2023-04-22 ENCOUNTER — Other Ambulatory Visit: Payer: Self-pay

## 2023-04-22 ENCOUNTER — Ambulatory Visit
Admission: RE | Admit: 2023-04-22 | Discharge: 2023-04-22 | Disposition: A | Discharge status: Home / Self Care | Payer: 59 | Source: Ambulatory Visit | Attending: Radiation Oncology | Admitting: Radiation Oncology

## 2023-04-22 DIAGNOSIS — C61 Malignant neoplasm of prostate: Secondary | ICD-10-CM | POA: Diagnosis not present

## 2023-04-22 DIAGNOSIS — Z191 Hormone sensitive malignancy status: Secondary | ICD-10-CM | POA: Diagnosis not present

## 2023-04-22 DIAGNOSIS — Z510 Encounter for antineoplastic radiation therapy: Secondary | ICD-10-CM | POA: Diagnosis not present

## 2023-04-22 LAB — RAD ONC ARIA SESSION SUMMARY
Course Elapsed Days: 21
Plan Fractions Treated to Date: 16
Plan Prescribed Dose Per Fraction: 2 Gy
Plan Total Fractions Prescribed: 38
Plan Total Prescribed Dose: 76 Gy
Reference Point Dosage Given to Date: 32 Gy
Reference Point Session Dosage Given: 2 Gy
Session Number: 16

## 2023-04-23 ENCOUNTER — Ambulatory Visit
Admission: RE | Admit: 2023-04-23 | Discharge: 2023-04-23 | Disposition: A | Discharge status: Home / Self Care | Payer: 59 | Source: Ambulatory Visit | Attending: Radiation Oncology | Admitting: Radiation Oncology

## 2023-04-23 ENCOUNTER — Other Ambulatory Visit: Payer: Self-pay

## 2023-04-23 DIAGNOSIS — Z191 Hormone sensitive malignancy status: Secondary | ICD-10-CM | POA: Diagnosis not present

## 2023-04-23 DIAGNOSIS — C61 Malignant neoplasm of prostate: Secondary | ICD-10-CM | POA: Diagnosis not present

## 2023-04-23 DIAGNOSIS — Z510 Encounter for antineoplastic radiation therapy: Secondary | ICD-10-CM | POA: Diagnosis not present

## 2023-04-23 LAB — RAD ONC ARIA SESSION SUMMARY
Course Elapsed Days: 22
Plan Fractions Treated to Date: 17
Plan Prescribed Dose Per Fraction: 2 Gy
Plan Total Fractions Prescribed: 38
Plan Total Prescribed Dose: 76 Gy
Reference Point Dosage Given to Date: 34 Gy
Reference Point Session Dosage Given: 2 Gy
Session Number: 17

## 2023-04-24 ENCOUNTER — Ambulatory Visit
Admission: RE | Admit: 2023-04-24 | Discharge: 2023-04-24 | Disposition: A | Discharge status: Home / Self Care | Payer: 59 | Source: Ambulatory Visit | Attending: Radiation Oncology | Admitting: Radiation Oncology

## 2023-04-24 ENCOUNTER — Other Ambulatory Visit: Payer: Self-pay

## 2023-04-24 DIAGNOSIS — Z191 Hormone sensitive malignancy status: Secondary | ICD-10-CM | POA: Diagnosis not present

## 2023-04-24 DIAGNOSIS — Z510 Encounter for antineoplastic radiation therapy: Secondary | ICD-10-CM | POA: Diagnosis not present

## 2023-04-24 DIAGNOSIS — C61 Malignant neoplasm of prostate: Secondary | ICD-10-CM | POA: Diagnosis not present

## 2023-04-24 LAB — RAD ONC ARIA SESSION SUMMARY
Course Elapsed Days: 23
Plan Fractions Treated to Date: 18
Plan Prescribed Dose Per Fraction: 2 Gy
Plan Total Fractions Prescribed: 38
Plan Total Prescribed Dose: 76 Gy
Reference Point Dosage Given to Date: 36 Gy
Reference Point Session Dosage Given: 2 Gy
Session Number: 18

## 2023-04-25 ENCOUNTER — Other Ambulatory Visit: Payer: Self-pay

## 2023-04-25 ENCOUNTER — Ambulatory Visit
Admission: RE | Admit: 2023-04-25 | Discharge: 2023-04-25 | Disposition: A | Discharge status: Home / Self Care | Payer: 59 | Source: Ambulatory Visit | Attending: Radiation Oncology | Admitting: Radiation Oncology

## 2023-04-25 DIAGNOSIS — Z510 Encounter for antineoplastic radiation therapy: Secondary | ICD-10-CM | POA: Diagnosis not present

## 2023-04-25 DIAGNOSIS — C61 Malignant neoplasm of prostate: Secondary | ICD-10-CM | POA: Diagnosis not present

## 2023-04-25 DIAGNOSIS — Z191 Hormone sensitive malignancy status: Secondary | ICD-10-CM | POA: Diagnosis not present

## 2023-04-25 LAB — RAD ONC ARIA SESSION SUMMARY
Course Elapsed Days: 24
Plan Fractions Treated to Date: 19
Plan Prescribed Dose Per Fraction: 2 Gy
Plan Total Fractions Prescribed: 38
Plan Total Prescribed Dose: 76 Gy
Reference Point Dosage Given to Date: 38 Gy
Reference Point Session Dosage Given: 2 Gy
Session Number: 19

## 2023-04-28 ENCOUNTER — Ambulatory Visit
Admission: RE | Admit: 2023-04-28 | Discharge: 2023-04-28 | Disposition: A | Discharge status: Home / Self Care | Payer: 59 | Source: Ambulatory Visit | Attending: Radiation Oncology | Admitting: Radiation Oncology

## 2023-04-28 ENCOUNTER — Other Ambulatory Visit: Payer: Self-pay

## 2023-04-28 DIAGNOSIS — Z191 Hormone sensitive malignancy status: Secondary | ICD-10-CM | POA: Diagnosis not present

## 2023-04-28 DIAGNOSIS — Z510 Encounter for antineoplastic radiation therapy: Secondary | ICD-10-CM | POA: Diagnosis not present

## 2023-04-28 DIAGNOSIS — C61 Malignant neoplasm of prostate: Secondary | ICD-10-CM | POA: Diagnosis not present

## 2023-04-28 LAB — RAD ONC ARIA SESSION SUMMARY
Course Elapsed Days: 27
Plan Fractions Treated to Date: 20
Plan Prescribed Dose Per Fraction: 2 Gy
Plan Total Fractions Prescribed: 38
Plan Total Prescribed Dose: 76 Gy
Reference Point Dosage Given to Date: 40 Gy
Reference Point Session Dosage Given: 2 Gy
Session Number: 20

## 2023-04-29 ENCOUNTER — Ambulatory Visit
Admission: RE | Admit: 2023-04-29 | Discharge: 2023-04-29 | Disposition: A | Discharge status: Home / Self Care | Payer: 59 | Source: Ambulatory Visit | Attending: Radiation Oncology | Admitting: Radiation Oncology

## 2023-04-29 ENCOUNTER — Other Ambulatory Visit: Payer: Self-pay

## 2023-04-29 DIAGNOSIS — Z510 Encounter for antineoplastic radiation therapy: Secondary | ICD-10-CM | POA: Diagnosis not present

## 2023-04-29 DIAGNOSIS — Z191 Hormone sensitive malignancy status: Secondary | ICD-10-CM | POA: Diagnosis not present

## 2023-04-29 DIAGNOSIS — C61 Malignant neoplasm of prostate: Secondary | ICD-10-CM | POA: Diagnosis not present

## 2023-04-29 LAB — RAD ONC ARIA SESSION SUMMARY
Course Elapsed Days: 28
Plan Fractions Treated to Date: 21
Plan Prescribed Dose Per Fraction: 2 Gy
Plan Total Fractions Prescribed: 38
Plan Total Prescribed Dose: 76 Gy
Reference Point Dosage Given to Date: 42 Gy
Reference Point Session Dosage Given: 2 Gy
Session Number: 21

## 2023-04-30 ENCOUNTER — Other Ambulatory Visit: Payer: Self-pay

## 2023-04-30 ENCOUNTER — Ambulatory Visit
Admission: RE | Admit: 2023-04-30 | Discharge: 2023-04-30 | Disposition: A | Discharge status: Home / Self Care | Payer: 59 | Source: Ambulatory Visit | Attending: Radiation Oncology | Admitting: Radiation Oncology

## 2023-04-30 DIAGNOSIS — Z510 Encounter for antineoplastic radiation therapy: Secondary | ICD-10-CM | POA: Diagnosis not present

## 2023-04-30 DIAGNOSIS — C61 Malignant neoplasm of prostate: Secondary | ICD-10-CM | POA: Diagnosis not present

## 2023-04-30 DIAGNOSIS — Z191 Hormone sensitive malignancy status: Secondary | ICD-10-CM | POA: Diagnosis not present

## 2023-04-30 LAB — RAD ONC ARIA SESSION SUMMARY
Course Elapsed Days: 29
Plan Fractions Treated to Date: 22
Plan Prescribed Dose Per Fraction: 2 Gy
Plan Total Fractions Prescribed: 38
Plan Total Prescribed Dose: 76 Gy
Reference Point Dosage Given to Date: 44 Gy
Reference Point Session Dosage Given: 2 Gy
Session Number: 22

## 2023-05-01 ENCOUNTER — Ambulatory Visit
Admission: RE | Admit: 2023-05-01 | Discharge: 2023-05-01 | Disposition: A | Discharge status: Home / Self Care | Payer: 59 | Source: Ambulatory Visit | Attending: Radiation Oncology | Admitting: Radiation Oncology

## 2023-05-01 ENCOUNTER — Other Ambulatory Visit: Payer: Self-pay

## 2023-05-01 DIAGNOSIS — C61 Malignant neoplasm of prostate: Secondary | ICD-10-CM | POA: Diagnosis not present

## 2023-05-01 DIAGNOSIS — Z510 Encounter for antineoplastic radiation therapy: Secondary | ICD-10-CM | POA: Diagnosis not present

## 2023-05-01 DIAGNOSIS — Z191 Hormone sensitive malignancy status: Secondary | ICD-10-CM | POA: Diagnosis not present

## 2023-05-01 LAB — RAD ONC ARIA SESSION SUMMARY
Course Elapsed Days: 30
Plan Fractions Treated to Date: 23
Plan Prescribed Dose Per Fraction: 2 Gy
Plan Total Fractions Prescribed: 38
Plan Total Prescribed Dose: 76 Gy
Reference Point Dosage Given to Date: 46 Gy
Reference Point Session Dosage Given: 2 Gy
Session Number: 23

## 2023-05-02 ENCOUNTER — Other Ambulatory Visit: Payer: Self-pay

## 2023-05-02 ENCOUNTER — Ambulatory Visit
Admission: RE | Admit: 2023-05-02 | Discharge: 2023-05-02 | Disposition: A | Discharge status: Home / Self Care | Payer: 59 | Source: Ambulatory Visit | Attending: Radiation Oncology | Admitting: Radiation Oncology

## 2023-05-02 DIAGNOSIS — Z510 Encounter for antineoplastic radiation therapy: Secondary | ICD-10-CM | POA: Diagnosis not present

## 2023-05-02 DIAGNOSIS — C61 Malignant neoplasm of prostate: Secondary | ICD-10-CM | POA: Diagnosis not present

## 2023-05-02 DIAGNOSIS — Z191 Hormone sensitive malignancy status: Secondary | ICD-10-CM | POA: Diagnosis not present

## 2023-05-02 LAB — RAD ONC ARIA SESSION SUMMARY
Course Elapsed Days: 31
Plan Fractions Treated to Date: 24
Plan Prescribed Dose Per Fraction: 2 Gy
Plan Total Fractions Prescribed: 38
Plan Total Prescribed Dose: 76 Gy
Reference Point Dosage Given to Date: 48 Gy
Reference Point Session Dosage Given: 2 Gy
Session Number: 24

## 2023-05-05 ENCOUNTER — Inpatient Hospital Stay: Payer: 59

## 2023-05-05 ENCOUNTER — Ambulatory Visit
Admission: RE | Admit: 2023-05-05 | Discharge: 2023-05-05 | Disposition: A | Discharge status: Home / Self Care | Payer: 59 | Source: Ambulatory Visit | Attending: Radiation Oncology | Admitting: Radiation Oncology

## 2023-05-05 ENCOUNTER — Other Ambulatory Visit: Payer: Self-pay

## 2023-05-05 DIAGNOSIS — Z510 Encounter for antineoplastic radiation therapy: Secondary | ICD-10-CM | POA: Diagnosis not present

## 2023-05-05 DIAGNOSIS — Z191 Hormone sensitive malignancy status: Secondary | ICD-10-CM | POA: Diagnosis not present

## 2023-05-05 DIAGNOSIS — C61 Malignant neoplasm of prostate: Secondary | ICD-10-CM | POA: Diagnosis not present

## 2023-05-05 LAB — RAD ONC ARIA SESSION SUMMARY
Course Elapsed Days: 34
Plan Fractions Treated to Date: 25
Plan Prescribed Dose Per Fraction: 2 Gy
Plan Total Fractions Prescribed: 38
Plan Total Prescribed Dose: 76 Gy
Reference Point Dosage Given to Date: 50 Gy
Reference Point Session Dosage Given: 2 Gy
Session Number: 25

## 2023-05-05 LAB — CBC (CANCER CENTER ONLY)
HCT: 35.7 % — ABNORMAL LOW (ref 39.0–52.0)
Hemoglobin: 11.6 g/dL — ABNORMAL LOW (ref 13.0–17.0)
MCH: 30.6 pg (ref 26.0–34.0)
MCHC: 32.5 g/dL (ref 30.0–36.0)
MCV: 94.2 fL (ref 80.0–100.0)
Platelet Count: 172 10*3/uL (ref 150–400)
RBC: 3.79 MIL/uL — ABNORMAL LOW (ref 4.22–5.81)
RDW: 12.8 % (ref 11.5–15.5)
WBC Count: 4.9 10*3/uL (ref 4.0–10.5)
nRBC: 0 % (ref 0.0–0.2)

## 2023-05-06 ENCOUNTER — Ambulatory Visit
Admission: RE | Admit: 2023-05-06 | Discharge: 2023-05-06 | Disposition: A | Discharge status: Home / Self Care | Payer: 59 | Source: Ambulatory Visit | Attending: Radiation Oncology | Admitting: Radiation Oncology

## 2023-05-06 ENCOUNTER — Other Ambulatory Visit: Payer: Self-pay

## 2023-05-06 DIAGNOSIS — Z191 Hormone sensitive malignancy status: Secondary | ICD-10-CM | POA: Diagnosis not present

## 2023-05-06 DIAGNOSIS — Z510 Encounter for antineoplastic radiation therapy: Secondary | ICD-10-CM | POA: Diagnosis not present

## 2023-05-06 DIAGNOSIS — C61 Malignant neoplasm of prostate: Secondary | ICD-10-CM | POA: Diagnosis not present

## 2023-05-06 LAB — RAD ONC ARIA SESSION SUMMARY
Course Elapsed Days: 35
Plan Fractions Treated to Date: 26
Plan Prescribed Dose Per Fraction: 2 Gy
Plan Total Fractions Prescribed: 38
Plan Total Prescribed Dose: 76 Gy
Reference Point Dosage Given to Date: 52 Gy
Reference Point Session Dosage Given: 2 Gy
Session Number: 26

## 2023-05-07 ENCOUNTER — Other Ambulatory Visit: Payer: Self-pay

## 2023-05-07 ENCOUNTER — Ambulatory Visit
Admission: RE | Admit: 2023-05-07 | Discharge: 2023-05-07 | Disposition: A | Discharge status: Home / Self Care | Payer: 59 | Source: Ambulatory Visit | Attending: Radiation Oncology | Admitting: Radiation Oncology

## 2023-05-07 DIAGNOSIS — C61 Malignant neoplasm of prostate: Secondary | ICD-10-CM | POA: Diagnosis not present

## 2023-05-07 DIAGNOSIS — Z510 Encounter for antineoplastic radiation therapy: Secondary | ICD-10-CM | POA: Diagnosis not present

## 2023-05-07 DIAGNOSIS — Z191 Hormone sensitive malignancy status: Secondary | ICD-10-CM | POA: Diagnosis not present

## 2023-05-07 LAB — RAD ONC ARIA SESSION SUMMARY
Course Elapsed Days: 36
Plan Fractions Treated to Date: 27
Plan Prescribed Dose Per Fraction: 2 Gy
Plan Total Fractions Prescribed: 38
Plan Total Prescribed Dose: 76 Gy
Reference Point Dosage Given to Date: 54 Gy
Reference Point Session Dosage Given: 2 Gy
Session Number: 27

## 2023-05-08 ENCOUNTER — Other Ambulatory Visit: Payer: Self-pay

## 2023-05-08 ENCOUNTER — Ambulatory Visit
Admission: RE | Admit: 2023-05-08 | Discharge: 2023-05-08 | Disposition: A | Discharge status: Home / Self Care | Payer: 59 | Source: Ambulatory Visit | Attending: Radiation Oncology | Admitting: Radiation Oncology

## 2023-05-08 DIAGNOSIS — C61 Malignant neoplasm of prostate: Secondary | ICD-10-CM | POA: Diagnosis not present

## 2023-05-08 DIAGNOSIS — Z191 Hormone sensitive malignancy status: Secondary | ICD-10-CM | POA: Diagnosis not present

## 2023-05-08 DIAGNOSIS — Z510 Encounter for antineoplastic radiation therapy: Secondary | ICD-10-CM | POA: Diagnosis not present

## 2023-05-08 LAB — RAD ONC ARIA SESSION SUMMARY
Course Elapsed Days: 37
Plan Fractions Treated to Date: 28
Plan Prescribed Dose Per Fraction: 2 Gy
Plan Total Fractions Prescribed: 38
Plan Total Prescribed Dose: 76 Gy
Reference Point Dosage Given to Date: 56 Gy
Reference Point Session Dosage Given: 2 Gy
Session Number: 28

## 2023-05-09 ENCOUNTER — Ambulatory Visit
Admission: RE | Admit: 2023-05-09 | Discharge: 2023-05-09 | Disposition: A | Discharge status: Home / Self Care | Payer: 59 | Source: Ambulatory Visit | Attending: Radiation Oncology | Admitting: Radiation Oncology

## 2023-05-09 ENCOUNTER — Other Ambulatory Visit: Payer: Self-pay

## 2023-05-09 DIAGNOSIS — Z191 Hormone sensitive malignancy status: Secondary | ICD-10-CM | POA: Diagnosis not present

## 2023-05-09 DIAGNOSIS — C61 Malignant neoplasm of prostate: Secondary | ICD-10-CM | POA: Diagnosis not present

## 2023-05-09 DIAGNOSIS — Z510 Encounter for antineoplastic radiation therapy: Secondary | ICD-10-CM | POA: Diagnosis not present

## 2023-05-09 LAB — RAD ONC ARIA SESSION SUMMARY
Course Elapsed Days: 38
Plan Fractions Treated to Date: 29
Plan Prescribed Dose Per Fraction: 2 Gy
Plan Total Fractions Prescribed: 38
Plan Total Prescribed Dose: 76 Gy
Reference Point Dosage Given to Date: 58 Gy
Reference Point Session Dosage Given: 2 Gy
Session Number: 29

## 2023-05-12 ENCOUNTER — Other Ambulatory Visit: Payer: Self-pay

## 2023-05-12 ENCOUNTER — Ambulatory Visit
Admission: RE | Admit: 2023-05-12 | Discharge: 2023-05-12 | Disposition: A | Discharge status: Home / Self Care | Payer: 59 | Source: Ambulatory Visit | Attending: Radiation Oncology | Admitting: Radiation Oncology

## 2023-05-12 DIAGNOSIS — Z191 Hormone sensitive malignancy status: Secondary | ICD-10-CM | POA: Diagnosis not present

## 2023-05-12 DIAGNOSIS — Z510 Encounter for antineoplastic radiation therapy: Secondary | ICD-10-CM | POA: Diagnosis not present

## 2023-05-12 DIAGNOSIS — C61 Malignant neoplasm of prostate: Secondary | ICD-10-CM | POA: Diagnosis not present

## 2023-05-12 LAB — RAD ONC ARIA SESSION SUMMARY
Course Elapsed Days: 41
Plan Fractions Treated to Date: 30
Plan Prescribed Dose Per Fraction: 2 Gy
Plan Total Fractions Prescribed: 38
Plan Total Prescribed Dose: 76 Gy
Reference Point Dosage Given to Date: 60 Gy
Reference Point Session Dosage Given: 2 Gy
Session Number: 30

## 2023-05-13 ENCOUNTER — Other Ambulatory Visit: Payer: Self-pay

## 2023-05-13 ENCOUNTER — Ambulatory Visit
Admission: RE | Admit: 2023-05-13 | Discharge: 2023-05-13 | Disposition: A | Discharge status: Home / Self Care | Payer: 59 | Source: Ambulatory Visit | Attending: Radiation Oncology | Admitting: Radiation Oncology

## 2023-05-13 DIAGNOSIS — Z191 Hormone sensitive malignancy status: Secondary | ICD-10-CM | POA: Diagnosis not present

## 2023-05-13 DIAGNOSIS — C61 Malignant neoplasm of prostate: Secondary | ICD-10-CM | POA: Diagnosis not present

## 2023-05-13 DIAGNOSIS — Z510 Encounter for antineoplastic radiation therapy: Secondary | ICD-10-CM | POA: Diagnosis not present

## 2023-05-13 LAB — RAD ONC ARIA SESSION SUMMARY
Course Elapsed Days: 42
Plan Fractions Treated to Date: 31
Plan Prescribed Dose Per Fraction: 2 Gy
Plan Total Fractions Prescribed: 38
Plan Total Prescribed Dose: 76 Gy
Reference Point Dosage Given to Date: 62 Gy
Reference Point Session Dosage Given: 2 Gy
Session Number: 31

## 2023-05-14 ENCOUNTER — Other Ambulatory Visit: Payer: Self-pay

## 2023-05-14 ENCOUNTER — Ambulatory Visit
Admission: RE | Admit: 2023-05-14 | Discharge: 2023-05-14 | Disposition: A | Discharge status: Home / Self Care | Payer: 59 | Source: Ambulatory Visit | Attending: Radiation Oncology | Admitting: Radiation Oncology

## 2023-05-14 DIAGNOSIS — C61 Malignant neoplasm of prostate: Secondary | ICD-10-CM | POA: Diagnosis not present

## 2023-05-14 DIAGNOSIS — Z191 Hormone sensitive malignancy status: Secondary | ICD-10-CM | POA: Diagnosis not present

## 2023-05-14 DIAGNOSIS — Z510 Encounter for antineoplastic radiation therapy: Secondary | ICD-10-CM | POA: Diagnosis not present

## 2023-05-14 LAB — RAD ONC ARIA SESSION SUMMARY
Course Elapsed Days: 43
Plan Fractions Treated to Date: 32
Plan Prescribed Dose Per Fraction: 2 Gy
Plan Total Fractions Prescribed: 38
Plan Total Prescribed Dose: 76 Gy
Reference Point Dosage Given to Date: 64 Gy
Reference Point Session Dosage Given: 2 Gy
Session Number: 32

## 2023-05-15 ENCOUNTER — Ambulatory Visit
Admission: RE | Admit: 2023-05-15 | Discharge: 2023-05-15 | Disposition: A | Discharge status: Home / Self Care | Payer: 59 | Source: Ambulatory Visit | Attending: Radiation Oncology | Admitting: Radiation Oncology

## 2023-05-15 ENCOUNTER — Other Ambulatory Visit: Payer: Self-pay

## 2023-05-15 DIAGNOSIS — C61 Malignant neoplasm of prostate: Secondary | ICD-10-CM | POA: Diagnosis not present

## 2023-05-15 DIAGNOSIS — Z510 Encounter for antineoplastic radiation therapy: Secondary | ICD-10-CM | POA: Diagnosis not present

## 2023-05-15 DIAGNOSIS — Z191 Hormone sensitive malignancy status: Secondary | ICD-10-CM | POA: Diagnosis not present

## 2023-05-15 LAB — RAD ONC ARIA SESSION SUMMARY
Course Elapsed Days: 44
Plan Fractions Treated to Date: 33
Plan Prescribed Dose Per Fraction: 2 Gy
Plan Total Fractions Prescribed: 38
Plan Total Prescribed Dose: 76 Gy
Reference Point Dosage Given to Date: 66 Gy
Reference Point Session Dosage Given: 2 Gy
Session Number: 33

## 2023-05-16 ENCOUNTER — Ambulatory Visit
Admission: RE | Admit: 2023-05-16 | Discharge: 2023-05-16 | Disposition: A | Discharge status: Home / Self Care | Payer: 59 | Source: Ambulatory Visit | Attending: Radiation Oncology | Admitting: Radiation Oncology

## 2023-05-16 ENCOUNTER — Other Ambulatory Visit: Payer: Self-pay

## 2023-05-16 DIAGNOSIS — Z191 Hormone sensitive malignancy status: Secondary | ICD-10-CM | POA: Diagnosis not present

## 2023-05-16 DIAGNOSIS — Z510 Encounter for antineoplastic radiation therapy: Secondary | ICD-10-CM | POA: Diagnosis not present

## 2023-05-16 DIAGNOSIS — C61 Malignant neoplasm of prostate: Secondary | ICD-10-CM | POA: Diagnosis not present

## 2023-05-16 LAB — RAD ONC ARIA SESSION SUMMARY
Course Elapsed Days: 45
Plan Fractions Treated to Date: 34
Plan Prescribed Dose Per Fraction: 2 Gy
Plan Total Fractions Prescribed: 38
Plan Total Prescribed Dose: 76 Gy
Reference Point Dosage Given to Date: 68 Gy
Reference Point Session Dosage Given: 2 Gy
Session Number: 34

## 2023-05-19 ENCOUNTER — Inpatient Hospital Stay: Payer: 59 | Attending: Radiation Oncology

## 2023-05-19 ENCOUNTER — Ambulatory Visit
Admission: RE | Admit: 2023-05-19 | Discharge: 2023-05-19 | Disposition: A | Discharge status: Home / Self Care | Payer: 59 | Source: Ambulatory Visit | Attending: Radiation Oncology | Admitting: Radiation Oncology

## 2023-05-19 ENCOUNTER — Other Ambulatory Visit: Payer: Self-pay

## 2023-05-19 DIAGNOSIS — Z191 Hormone sensitive malignancy status: Secondary | ICD-10-CM | POA: Diagnosis not present

## 2023-05-19 DIAGNOSIS — C61 Malignant neoplasm of prostate: Secondary | ICD-10-CM | POA: Diagnosis not present

## 2023-05-19 DIAGNOSIS — Z510 Encounter for antineoplastic radiation therapy: Secondary | ICD-10-CM | POA: Insufficient documentation

## 2023-05-19 LAB — RAD ONC ARIA SESSION SUMMARY
Course Elapsed Days: 48
Plan Fractions Treated to Date: 35
Plan Prescribed Dose Per Fraction: 2 Gy
Plan Total Fractions Prescribed: 38
Plan Total Prescribed Dose: 76 Gy
Reference Point Dosage Given to Date: 70 Gy
Reference Point Session Dosage Given: 2 Gy
Session Number: 35

## 2023-05-19 LAB — CBC (CANCER CENTER ONLY)
HCT: 33.2 % — ABNORMAL LOW (ref 39.0–52.0)
Hemoglobin: 10.8 g/dL — ABNORMAL LOW (ref 13.0–17.0)
MCH: 31.3 pg (ref 26.0–34.0)
MCHC: 32.5 g/dL (ref 30.0–36.0)
MCV: 96.2 fL (ref 80.0–100.0)
Platelet Count: 170 10*3/uL (ref 150–400)
RBC: 3.45 MIL/uL — ABNORMAL LOW (ref 4.22–5.81)
RDW: 13.3 % (ref 11.5–15.5)
WBC Count: 4.4 10*3/uL (ref 4.0–10.5)
nRBC: 0 % (ref 0.0–0.2)

## 2023-05-20 ENCOUNTER — Other Ambulatory Visit: Payer: Self-pay

## 2023-05-20 ENCOUNTER — Ambulatory Visit
Admission: RE | Admit: 2023-05-20 | Discharge: 2023-05-20 | Disposition: A | Discharge status: Home / Self Care | Payer: 59 | Source: Ambulatory Visit | Attending: Radiation Oncology | Admitting: Radiation Oncology

## 2023-05-20 DIAGNOSIS — Z191 Hormone sensitive malignancy status: Secondary | ICD-10-CM | POA: Diagnosis not present

## 2023-05-20 DIAGNOSIS — Z510 Encounter for antineoplastic radiation therapy: Secondary | ICD-10-CM | POA: Diagnosis not present

## 2023-05-20 DIAGNOSIS — C61 Malignant neoplasm of prostate: Secondary | ICD-10-CM | POA: Diagnosis not present

## 2023-05-20 LAB — RAD ONC ARIA SESSION SUMMARY
Course Elapsed Days: 49
Plan Fractions Treated to Date: 36
Plan Prescribed Dose Per Fraction: 2 Gy
Plan Total Fractions Prescribed: 38
Plan Total Prescribed Dose: 76 Gy
Reference Point Dosage Given to Date: 72 Gy
Reference Point Session Dosage Given: 2 Gy
Session Number: 36

## 2023-05-21 ENCOUNTER — Ambulatory Visit
Admission: RE | Admit: 2023-05-21 | Discharge: 2023-05-21 | Disposition: A | Discharge status: Home / Self Care | Payer: 59 | Source: Ambulatory Visit | Attending: Radiation Oncology | Admitting: Radiation Oncology

## 2023-05-21 ENCOUNTER — Other Ambulatory Visit: Payer: Self-pay

## 2023-05-21 DIAGNOSIS — Z510 Encounter for antineoplastic radiation therapy: Secondary | ICD-10-CM | POA: Diagnosis not present

## 2023-05-21 DIAGNOSIS — C61 Malignant neoplasm of prostate: Secondary | ICD-10-CM | POA: Diagnosis not present

## 2023-05-21 DIAGNOSIS — Z191 Hormone sensitive malignancy status: Secondary | ICD-10-CM | POA: Diagnosis not present

## 2023-05-21 LAB — RAD ONC ARIA SESSION SUMMARY
Course Elapsed Days: 50
Plan Fractions Treated to Date: 37
Plan Prescribed Dose Per Fraction: 2 Gy
Plan Total Fractions Prescribed: 38
Plan Total Prescribed Dose: 76 Gy
Reference Point Dosage Given to Date: 74 Gy
Reference Point Session Dosage Given: 2 Gy
Session Number: 37

## 2023-05-22 ENCOUNTER — Other Ambulatory Visit: Payer: Self-pay

## 2023-05-22 ENCOUNTER — Ambulatory Visit
Admission: RE | Admit: 2023-05-22 | Discharge: 2023-05-22 | Disposition: A | Discharge status: Home / Self Care | Payer: 59 | Source: Ambulatory Visit | Attending: Radiation Oncology | Admitting: Radiation Oncology

## 2023-05-22 DIAGNOSIS — C61 Malignant neoplasm of prostate: Secondary | ICD-10-CM | POA: Diagnosis not present

## 2023-05-22 DIAGNOSIS — Z510 Encounter for antineoplastic radiation therapy: Secondary | ICD-10-CM | POA: Diagnosis not present

## 2023-05-22 DIAGNOSIS — Z191 Hormone sensitive malignancy status: Secondary | ICD-10-CM | POA: Diagnosis not present

## 2023-05-22 LAB — RAD ONC ARIA SESSION SUMMARY
Course Elapsed Days: 51
Plan Fractions Treated to Date: 38
Plan Prescribed Dose Per Fraction: 2 Gy
Plan Total Fractions Prescribed: 38
Plan Total Prescribed Dose: 76 Gy
Reference Point Dosage Given to Date: 76 Gy
Reference Point Session Dosage Given: 2 Gy
Session Number: 38

## 2023-05-23 NOTE — Radiation Completion Notes (Signed)
 Patient Name: Jack Marshall, Jack Marshall MRN: [id number] Date of Birth: [birth year] Referring Physician: Nona Dell, M.D. Date of Service: 2023-05-23 Radiation Oncologist: Carmina Miller, M.D. [facility] [place]                             RADIATION ONCOLOGY END OF TREATMENT NOTE     Diagnosis: C61 Malignant neoplasm of prostate Intent: Curative     HPI: Patient is a 63 year old male who was seen in consultation for rising PSA after robotic assisted prostatectomy for Gleason 9 (4+5) adenocarcinoma presenting with a PSA in the 5 range.  He is still having urinary incontinence and does wear a pad.  We ran a PSMA PET scan on him.  Which fortunately showed no evidence of local prostate cancer recurrence no evidence of metastatic adenopathy in the pelvis or periotic retroperitoneum or evidence of visceral mets or skeletal mets.  He is otherwise doing well at this time.  He had a PSA 2 weeks ago which was 0.4 up from 0.33 weeks ago      ==========DELIVERED PLANS==========  First Treatment Date: 2023-04-01 Last Treatment Date: 2023-05-22   Plan Name: ProstBed Site: Prostate Bed Technique: IMRT Mode: Photon Dose Per Fraction: 2 Gy Prescribed Dose (Delivered / Prescribed): 76 Gy / 76 Gy Prescribed Fxs (Delivered / Prescribed): 38 / 38     ==========ON TREATMENT VISIT DATES========== 2023-04-01, 2023-04-08, 2023-04-15, 2023-04-22, 2023-05-06, 2023-05-08, 2023-05-13, 2023-05-20     ==========UPCOMING VISITS========== 07/02/2023 [facility] PHYS PHYSICAL Joni Reining, PA-C  06/25/2023 [facility] PHYS NURSE PHYSICAL [facility] NURSE  06/23/2023 [facility] RAD ONCOLOGY FOLLOW UP 30 Chrystal, Sherrine Maples, MD        ==========APPENDIX - ON TREATMENT VISIT NOTES==========   See weekly On Treatment Notes in Epic for details in the Media tab (listed as Progress notes on the On Treatment Visit Dates listed above).

## 2023-06-10 ENCOUNTER — Other Ambulatory Visit: Payer: Self-pay | Admitting: Physician Assistant

## 2023-06-16 ENCOUNTER — Telehealth: Payer: Self-pay

## 2023-06-16 NOTE — Telephone Encounter (Signed)
 Spoke with Jack Marshall on Friday (06/13/2023) about orders for CPAP supplies.  We received the Certificate of Medical Necessity for CPAP Therapy from [employer].  Explained to him that PA Felipe Horton has to sign the form & that he's not in the office on Fridays and that we will get PA Felipe Horton to sign the form Monday (06/16/2023) and fax the form back to [employer].  Certificate of Medical Necessity for CPAP Therapy signed by Caryl Clas, PA-C & the form faxed to [employer] at [phone].

## 2023-06-23 ENCOUNTER — Encounter: Payer: Self-pay | Admitting: Radiation Oncology

## 2023-06-23 ENCOUNTER — Ambulatory Visit
Admission: RE | Admit: 2023-06-23 | Discharge: 2023-06-23 | Disposition: A | Discharge status: Home / Self Care | Source: Ambulatory Visit | Attending: Radiation Oncology | Admitting: Radiation Oncology

## 2023-06-23 ENCOUNTER — Other Ambulatory Visit: Payer: Self-pay | Admitting: *Deleted

## 2023-06-23 VITALS — BP 110/74 | HR 89 | Temp 98.3°F | Resp 16 | Wt 168.0 lb

## 2023-06-23 DIAGNOSIS — C61 Malignant neoplasm of prostate: Secondary | ICD-10-CM

## 2023-06-23 NOTE — Progress Notes (Signed)
 Radiation Oncology Follow up Note  Name: Jack Marshall   Date:   06/23/2023 MRN:  [id number] DOB: Dec 27, 1960    This 63 y.o. male presents to the clinic today for 1 month follow-up status post salvage radiation therapy for Gleason 9 (4+5) adenocarcinoma the prostate presenting with a PSA in the 5 range status post robotic assisted prostatectomy for stage IIIc (pT2 N0 M0).  REFERRING PROVIDER: Marcina Severe, PA-C  HPI: Patient is a 63 year old male now out 1 month having completed salvage radiation therapy to his prostate fossa and pelvic lymph nodes for Gleason 9 (4+5) adenocarcinoma status post robotic assisted prostatectomy.  Seen today in routine follow-up he is still having urine incontinence which she had prior to his treatments.  He also have urinary frequency and nocturia again fairly stable from prior to his treatments.  He is having no bowel problems at this time.  He is having some hot flashes for which I have recommended vitamin D supplements..  COMPLICATIONS OF TREATMENT: none  FOLLOW UP COMPLIANCE: keeps appointments   PHYSICAL EXAM:  BP 110/74   Pulse 89   Temp 98.3 F (36.8 C)   Resp 16   Wt 168 lb (76.2 kg)   BMI 23.43 kg/m  Well-developed well-nourished patient in NAD. HEENT reveals PERLA, EOMI, discs not visualized.  Oral cavity is clear. No oral mucosal lesions are identified. Neck is clear without evidence of cervical or supraclavicular adenopathy. Lungs are clear to A&P. Cardiac examination is essentially unremarkable with regular rate and rhythm without murmur rub or thrill. Abdomen is benign with no organomegaly or masses noted. Motor sensory and DTR levels are equal and symmetric in the upper and lower extremities. Cranial nerves II through XII are grossly intact. Proprioception is intact. No peripheral adenopathy or edema is identified. No motor or sensory levels are noted. Crude visual fields are within normal range.  RADIOLOGY RESULTS: Current films for  review  PLAN: The present time patient is doing fairly well very unchanged lower urinary tract symptoms from before his radiation therapy.  I have asked him to address his incontinence issues with urology further recommendations.  I have recommended vitamin E for his hot flashes.  I have asked to see him back in 3 months with a repeat PSA at that time.  Patient comprehends my recommendations well.  I would like to take this opportunity to thank you for allowing me to participate in the care of your patient.Glenis Langdon, MD

## 2023-06-25 ENCOUNTER — Ambulatory Visit: Payer: Self-pay

## 2023-06-25 DIAGNOSIS — Z0000 Encounter for general adult medical examination without abnormal findings: Secondary | ICD-10-CM

## 2023-06-25 LAB — POCT URINALYSIS DIPSTICK
Bilirubin, UA: NEGATIVE
Blood, UA: NEGATIVE
Glucose, UA: NEGATIVE
Ketones, UA: NEGATIVE
Leukocytes, UA: NEGATIVE
Nitrite, UA: NEGATIVE
Protein, UA: NEGATIVE
Spec Grav, UA: 1.015 (ref 1.010–1.025)
Urobilinogen, UA: 0.2 U/dL
pH, UA: 6 (ref 5.0–8.0)

## 2023-06-26 LAB — CMP12+LP+TP+TSH+6AC+PSA+CBC…
ALT: 27 IU/L (ref 0–44)
AST: 25 IU/L (ref 0–40)
Albumin: 4.8 g/dL (ref 3.9–4.9)
Alkaline Phosphatase: 65 IU/L (ref 44–121)
BUN/Creatinine Ratio: 15 (ref 10–24)
BUN: 18 mg/dL (ref 8–27)
Basophils Absolute: 0 10*3/uL (ref 0.0–0.2)
Basos: 1 %
Bilirubin Total: 0.4 mg/dL (ref 0.0–1.2)
Calcium: 10.6 mg/dL — ABNORMAL HIGH (ref 8.6–10.2)
Chloride: 98 mmol/L (ref 96–106)
Chol/HDL Ratio: 2.4 ratio (ref 0.0–5.0)
Cholesterol, Total: 158 mg/dL (ref 100–199)
Creatinine, Ser: 1.17 mg/dL (ref 0.76–1.27)
EOS (ABSOLUTE): 0.2 10*3/uL (ref 0.0–0.4)
Eos: 4 %
Estimated CHD Risk: <0.5 times avg. (ref 0.0–1.0)
Free Thyroxine Index: 1.9 (ref 1.2–4.9)
GGT: 25 IU/L (ref 0–65)
Globulin, Total: 2.8 g/dL (ref 1.5–4.5)
Glucose: 96 mg/dL (ref 70–99)
HDL: 66 mg/dL (ref >39)
Hematocrit: 37.9 % (ref 37.5–51.0)
Hemoglobin: 12.6 g/dL — ABNORMAL LOW (ref 13.0–17.7)
Immature Grans (Abs): 0 10*3/uL (ref 0.0–0.1)
Immature Granulocytes: 0 %
Iron: 69 ug/dL (ref 38–169)
LDH: 131 IU/L (ref 121–224)
LDL Chol Calc (NIH): 67 mg/dL (ref 0–99)
Lymphocytes Absolute: 0.9 10*3/uL (ref 0.7–3.1)
Lymphs: 15 %
MCH: 31.7 pg (ref 26.6–33.0)
MCHC: 33.2 g/dL (ref 31.5–35.7)
MCV: 96 fL (ref 79–97)
Monocytes Absolute: 0.8 10*3/uL (ref 0.1–0.9)
Monocytes: 13 %
Neutrophils Absolute: 4.2 10*3/uL (ref 1.4–7.0)
Neutrophils: 67 %
Phosphorus: 3.9 mg/dL (ref 2.8–4.1)
Platelets: 244 10*3/uL (ref 150–450)
Potassium: 4.3 mmol/L (ref 3.5–5.2)
Prostate Specific Ag, Serum: <0.1 ng/mL (ref 0.0–4.0)
RBC: 3.97 x10E6/uL — ABNORMAL LOW (ref 4.14–5.80)
RDW: 12.6 % (ref 11.6–15.4)
Sodium: 138 mmol/L (ref 134–144)
T3 Uptake Ratio: 25 % (ref 24–39)
T4, Total: 7.6 ug/dL (ref 4.5–12.0)
TSH: 2.45 u[IU]/mL (ref 0.450–4.500)
Total Protein: 7.6 g/dL (ref 6.0–8.5)
Triglycerides: 148 mg/dL (ref 0–149)
Uric Acid: 5.4 mg/dL (ref 3.8–8.4)
VLDL Cholesterol Cal: 25 mg/dL (ref 5–40)
WBC: 6.2 10*3/uL (ref 3.4–10.8)
eGFR: 71 mL/min/{1.73_m2} (ref >59)

## 2023-06-26 LAB — HGB A1C W/O EAG: Hgb A1c MFr Bld: 6.1 % — ABNORMAL HIGH (ref 4.8–5.6)

## 2023-06-26 LAB — MICROALBUMIN / CREATININE URINE RATIO
Creatinine, Urine: 120.4 mg/dL
Microalb/Creat Ratio: 5 mg/g{creat} (ref 0–29)
Microalbumin, Urine: 6.5 ug/mL

## 2023-07-02 ENCOUNTER — Encounter: Payer: Self-pay | Admitting: Physician Assistant

## 2023-07-02 ENCOUNTER — Ambulatory Visit: Payer: Self-pay | Admitting: Physician Assistant

## 2023-07-02 VITALS — BP 112/83 | HR 103 | Temp 97.6°F | Resp 14 | Ht 71.0 in | Wt 163.8 lb

## 2023-07-02 DIAGNOSIS — Z0000 Encounter for general adult medical examination without abnormal findings: Secondary | ICD-10-CM

## 2023-07-02 NOTE — Progress Notes (Signed)
 [facility] [place] ____________________________________________   None    (approximate)  I have reviewed the triage vital signs and the nursing notes.   HISTORY  Chief Complaint No chief complaint on file.   HPI Jack Marshall is a 63 y.o. male patient presents for annual physical exam.  Voices concern for urinary frequency and urgency status post prostate surgery and continue radiation treatments. He has not spoken to his oncologist or urologist about these concerns.         Past Medical History:  Diagnosis Date   Colon polyp    2009, most recent in 2020 no polyps   Decreased vision of right eye    due to an accident    Diabetes mellitus without complication (HCC)    type 2   Family history of colon cancer    Hyperlipidemia    Lack of exercise    Overweight    Prostate cancer (HCC)    Seasonal allergies    Sleep apnea    does not wear cpap   Testicular mass    right    Ventral hernia     Patient Active Problem List   Diagnosis Date Noted   Prostate cancer (HCC) 01/13/2023   OSA (obstructive sleep apnea) 12/02/2022   Sleepwalking 12/02/2022   Type 2 diabetes mellitus without complication, without long-term current use of insulin (HCC) 10/20/2018   Elevated BP without diagnosis of hypertension 10/20/2018   Other hyperlipidemia 10/20/2018   Erectile dysfunction 10/20/2018   Family history of colon cancer 10/20/2018    Past Surgical History:  Procedure Laterality Date   COLONOSCOPY     normal colon   COLONOSCOPY W/ POLYPECTOMY  12/09/2005   9 mm TA in ascending colon   COLONOSCOPY WITH PROPOFOL  N/A 09/23/2018   Procedure: COLONOSCOPY WITH PROPOFOL ;  Surgeon: Toledo, Alphonsus Jeans, MD;  Location: [facility] ENDOSCOPY;  Service: Gastroenterology;  Laterality: N/A;   EYE SURGERY     PELVIC LYMPH NODE DISSECTION Bilateral 01/13/2023   Procedure: PELVIC LYMPH NODE DISSECTION;  Surgeon: Lawerence Pressman, MD;  Location: [facility] ORS;   Service: Urology;  Laterality: Bilateral;   ROBOT ASSISTED LAPAROSCOPIC RADICAL PROSTATECTOMY N/A 01/13/2023   Procedure: XI ROBOTIC ASSISTED LAPAROSCOPIC RADICAL PROSTATECTOMY;  Surgeon: Lawerence Pressman, MD;  Location: [facility] ORS;  Service: Urology;  Laterality: N/A;   TESTICULAR EXPLORATION      Prior to Admission medications   Medication Sig Start Date End Date Taking? Authorizing Provider  Ascorbic Acid (VITAMIN C PO) Take 1 tablet by mouth daily.    [provider]  atorvastatin  (LIPITOR) 10 MG tablet Take 1 tablet (10 mg total) by mouth daily. 02/14/23   Marcina Severe, PA-C  b complex vitamins capsule Take 1 capsule by mouth daily.    [provider]  Continuous Glucose Sensor (FREESTYLE LIBRE 3 PLUS SENSOR) MISC CHANGE SENSOR EVERY 15 DAYS 06/10/23   Marcina Severe, PA-C  leuprolide , 6 Month, (ELIGARD ) 45 MG injection Inject 45 mg into the skin every 6 (six) months.    [provider]  metFORMIN  (GLUCOPHAGE ) 1000 MG tablet Take 1 tablet (1,000 mg total) by mouth 2 (two) times daily with a meal. 02/14/23   Marcina Severe, PA-C  Multiple Vitamin (MULTIVITAMIN) capsule Take 1 capsule by mouth daily.    [provider]    Allergies Patient has no known allergies.  Family History  Problem Relation Age of Onset   Diabetes Mother  Colon cancer Father    Prostate cancer Father    Colon cancer Brother     Social History Social History   Tobacco Use   Smoking status: Never    Passive exposure: Never   Smokeless tobacco: Never  Vaping Use   Vaping status: Never Used  Substance Use Topics   Alcohol use: Yes   Drug use: Not Currently    Review of Systems Constitutional: No fever/chills Eyes: No visual changes. ENT: No sore throat. Cardiovascular: Denies chest pain. Respiratory: Denies shortness of breath. Gastrointestinal: No abdominal pain.  No nausea, no vomiting.  No diarrhea.  No constipation. Genitourinary: Negative for dysuria.   Prostate cancer Musculoskeletal: Negative for back pain. Skin: Negative for rash. Neurological: Negative for headaches, focal weakness or numbness. Endocrine: Diabetes and hyperlipidemia  ____________________________________________   PHYSICAL EXAM:  VITAL SIGNS: BP 112/83  Cuff Size Normal  Pulse Rate 103Pulse Rate. 103. Data is abnormal. Taken on 07/02/23 8:50 AM  Temp 97.6 F (36.4 C)  Temp Source Temporal  Resp 14  SpO2 97 %   Constitutional: Alert and oriented. Well appearing and in no acute distress. Eyes: Conjunctivae are normal. PERRL. EOMI. Head: Atraumatic. Nose: No congestion/rhinnorhea. Mouth/Throat: Mucous membranes are moist.  Oropharynx non-erythematous. Neck: No stridor No cervical spine tenderness to palpation. Hematological/Lymphatic/Immunilogical: No cervical lymphadenopathy. Cardiovascular: Normal rate, regular rhythm. Grossly normal heart sounds.  Good peripheral circulation. Respiratory: Normal respiratory effort.  No retractions. Lungs CTAB. Gastrointestinal: Soft and nontender. No distention. No abdominal bruits. No CVA tenderness. Genitourinary: Deferred Musculoskeletal: No lower extremity tenderness nor edema.  No joint effusions. Neurologic:  Normal speech and language. No gross focal neurologic deficits are appreciated. No gait instability. Skin:  Skin is warm, dry and intact. No rash noted. Psychiatric: Mood and affect are normal. Speech and behavior are normal.  ____________________________________________   LABS          Component Ref Range & Units (hover) 7 d ago (06/25/23) 1 mo ago (05/19/23) 1 mo ago (05/05/23) 2 mo ago (04/21/23) 2 mo ago (04/07/23) 4 mo ago (03/03/23) 4 mo ago (02/24/23)  Glucose 96        Uric Acid 5.4        Comment:            Therapeutic target for gout patients: <6.0  BUN 18        Creatinine, Ser 1.17        eGFR 71        BUN/Creatinine Ratio 15        Sodium 138        Potassium 4.3        Chloride 98         Calcium  10.6 High         Phosphorus 3.9        Total Protein 7.6        Albumin 4.8        Globulin, Total 2.8        Bilirubin Total 0.4        Alkaline Phosphatase 65        LDH 131        AST 25        ALT 27        GGT 25        Iron 69        Cholesterol, Total 158        Triglycerides 148        HDL  66        VLDL Cholesterol Cal 25        LDL Chol Calc (NIH) 67        Chol/HDL Ratio 2.4        Comment:                                   T. Chol/HDL Ratio                                             Men  Women                               1/2 Avg.Risk  3.4    3.3                                   Avg.Risk  5.0    4.4                                2X Avg.Risk  9.6    7.1                                3X Avg.Risk 23.4   11.0  Estimated CHD Risk  < 0.5        Comment: The CHD Risk is based on the T. Chol/HDL ratio. Other factors affect CHD Risk such as hypertension, smoking, diabetes, severe obesity, and family history of premature CHD.  TSH 2.450        T4, Total 7.6        T3 Uptake Ratio 25        Free Thyroxine Index 1.9        Prostate Specific Ag, Serum <0.1     0.4 CM 0.3 CM  Comment: Roche ECLIA methodology. According to the American Urological Association, Serum PSA should decrease and remain at undetectable levels after radical prostatectomy. The AUA defines biochemical recurrence as an initial PSA value 0.2 ng/mL or greater followed by a subsequent confirmatory PSA value 0.2 ng/mL or greater. Values obtained with different assay methods or kits cannot be used interchangeably. Results cannot be interpreted as absolute evidence of the presence or absence of malignant disease.  WBC 6.2 4.4 R 4.9 R 4.9 R 5.9 R    RBC 3.97 Low  3.45 Low  R 3.79 Low  R 4.02 Low  R 4.14 Low  R    Hemoglobin 12.6 Low  10.8 Low  R 11.6 Low  R 12.2 Low  R 12.6 Low  R    Hematocrit 37.9 33.2 Low  R 35.7 Low  R 36.9 Low  R 38.7 Low  R    MCV 96 96.2 R 94.2 R 91.8 R 93.5 R    MCH  31.7 31.3 R 30.6 R 30.3 R 30.4 R    MCHC 33.2 32.5 R 32.5 R 33.1 R 32.6 R    RDW 12.6 13.3 R 12.8 R 12.0 R 12.0 R    Platelets 244 170 R 172 R 165 R 249 R  Neutrophils 67        Lymphs 15        Monocytes 13        Eos 4        Basos 1        Neutrophils Absolute 4.2        Lymphocytes Absolute 0.9        Monocytes Absolute 0.8        EOS (ABSOLUTE) 0.2        Basophils Absolute 0.0        Immature Granulocytes 0        Immature Grans (Abs) 0.0        Resulting Agency [facility]          View All Conversations on this Encounter     Back to Top Diabetic kidney evaluation - eGFR measurement (Yearly)  Next due on 06/24/2024  Address Topic                 Component Ref Range & Units (hover) 7 d ago (06/25/23) 1 yr ago (06/13/22) 2 yr ago (06/22/21) 2 yr ago (10/24/20) 3 yr ago (06/28/20) 3 yr ago (12/29/19) 4 yr ago (06/24/19)  Hgb A1c MFr Bld 6.1 High  6.0 High  CM 5.7 High  CM R 5.7 High  CM 6.0 High  CM 6.1 High  CM  Comment:          Prediabetes: 5.7 - 6.4          Diabetes: >6.4          Glycemic control for adults with diabetes: <7.0  HbA1c POC (<> result, manual entry)    5.0 R                ____________________________________________  EKG  Sinus rhythm at 99 bpm ____________________________________________    ____________________________________________   INITIAL IMPRESSION / ASSESSMENT AND PLAN  As part of my medical decision making, I reviewed the following data within the electronic MEDICAL RECORD NUMBER Notes from prior ED visits and [place] Controlled Substance Database     No acute findings on physical exam, EKG, labs.  Advised patient to follow-up oncologist and urologist  for urinary complaints.        ____________________________________________   FINAL CLINICAL IMPRESSION Well exam   ED Discharge Orders     None        Note:  This document was prepared using Dragon voice  recognition software and may include unintentional dictation errors.

## 2023-07-02 NOTE — Progress Notes (Signed)
 Pt presents today to complete physical, pt complains of forgetfulness, fatigue due to hormone treatment.

## 2023-07-03 DIAGNOSIS — G4733 Obstructive sleep apnea (adult) (pediatric): Secondary | ICD-10-CM | POA: Diagnosis not present

## 2023-07-17 ENCOUNTER — Other Ambulatory Visit: Payer: Self-pay | Admitting: Physician Assistant

## 2023-07-17 DIAGNOSIS — E119 Type 2 diabetes mellitus without complications: Secondary | ICD-10-CM

## 2023-08-03 DIAGNOSIS — G4733 Obstructive sleep apnea (adult) (pediatric): Secondary | ICD-10-CM | POA: Diagnosis not present

## 2023-08-06 DIAGNOSIS — G4733 Obstructive sleep apnea (adult) (pediatric): Secondary | ICD-10-CM | POA: Diagnosis not present

## 2023-09-02 DIAGNOSIS — G4733 Obstructive sleep apnea (adult) (pediatric): Secondary | ICD-10-CM | POA: Diagnosis not present

## 2023-09-05 NOTE — Progress Notes (Signed)
 [facility] [address] [place], [place] [address]  Pulmonary Sleep Medicine   Office Visit Note  Patient Name: Jack Marshall DOB: 12/09/1960 MRN [id number]    Chief Complaint: Obstructive Sleep Apnea visit  Brief History:  Jack Marshall is seen today for a follow up visit for APAP@ 4-20 cmH2O. The patient has a 16 year history of sleep apnea. Patient is using PAP nightly.  The patient feels somewhat rested after sleeping with PAP.  The patient reports benefiting from PAP use. Reported sleepiness is  improved and the Epworth Sleepiness Score is 12 out of 24. The patient does not take naps. The patient complains of the following: pt is still exhibiting some fatigue.  The compliance download shows 89% compliance with an average use time of 6 hours 11 minutes. The AHI is 2.4  The patient does complain of some restlessness of limb movements disrupting sleep, he finds it takes over an hour nightly to fall asleep. . The patient continues to require PAP therapy in order to eliminate sleep apnea.   ROS  General: (-) fever, (-) chills, (-) night sweat Nose and Sinuses: (-) nasal stuffiness or itchiness, (-) postnasal drip, (-) nosebleeds, (-) sinus trouble. Mouth and Throat: (-) sore throat, (-) hoarseness. Neck: (-) swollen glands, (-) enlarged thyroid, (-) neck pain. Respiratory: - cough, - shortness of breath, - wheezing. Neurologic: - numbness, - tingling. Psychiatric: - anxiety, - depression   Current Medication: Outpatient Encounter Medications as of 09/08/2023  Medication Sig   gabapentin  (NEURONTIN ) 100 MG capsule Take one capsule 90 minutes prior to bedtime. After two weeks increase to two capsules.   Ascorbic Acid (VITAMIN C PO) Take 1 tablet by mouth daily.   atorvastatin  (LIPITOR) 10 MG tablet Take 1 tablet (10 mg total) by mouth daily.   b complex vitamins capsule Take 1 capsule by mouth daily.   Continuous Glucose Sensor (FREESTYLE LIBRE 3 PLUS SENSOR) MISC  CHANGE SENSOR EVERY 15 DAYS   leuprolide , 6 Month, (ELIGARD ) 45 MG injection Inject 45 mg into the skin every 6 (six) months.   metFORMIN  (GLUCOPHAGE ) 1000 MG tablet Take 1 tablet (1,000 mg total) by mouth 2 (two) times daily with a meal.   Multiple Vitamin (MULTIVITAMIN) capsule Take 1 capsule by mouth daily.   No facility-administered encounter medications on file as of 09/08/2023.    Surgical History: Past Surgical History:  Procedure Laterality Date   COLONOSCOPY     normal colon   COLONOSCOPY W/ POLYPECTOMY  12/09/2005   9 mm TA in ascending colon   COLONOSCOPY WITH PROPOFOL  N/A 09/23/2018   Procedure: COLONOSCOPY WITH PROPOFOL ;  Surgeon: Toledo, Ladell POUR, MD;  Location: [facility] ENDOSCOPY;  Service: Gastroenterology;  Laterality: N/A;   EYE SURGERY     PELVIC LYMPH NODE DISSECTION Bilateral 01/13/2023   Procedure: PELVIC LYMPH NODE DISSECTION;  Surgeon: Francisca Redell BROCKS, MD;  Location: [facility] ORS;  Service: Urology;  Laterality: Bilateral;   ROBOT ASSISTED LAPAROSCOPIC RADICAL PROSTATECTOMY N/A 01/13/2023   Procedure: XI ROBOTIC ASSISTED LAPAROSCOPIC RADICAL PROSTATECTOMY;  Surgeon: Francisca Redell BROCKS, MD;  Location: [facility] ORS;  Service: Urology;  Laterality: N/A;   TESTICULAR EXPLORATION      Medical History: Past Medical History:  Diagnosis Date   Colon polyp    2009, most recent in 2020 no polyps   Decreased vision of right eye    due to an accident    Diabetes mellitus without complication (HCC)    type 2   Family history  of colon cancer    Hyperlipidemia    Lack of exercise    Overweight    Prostate cancer (HCC)    Seasonal allergies    Sleep apnea    does not wear cpap   Testicular mass    right    Ventral hernia     Family History: Non contributory to the present illness  Social History: Social History   Socioeconomic History   Marital status: Married    Spouse name: Not on file   Number of children: Not on file   Years of education: Not on file   Highest  education level: Not on file  Occupational History   Not on file  Tobacco Use   Smoking status: Never    Passive exposure: Never   Smokeless tobacco: Never  Vaping Use   Vaping status: Never Used  Substance and Sexual Activity   Alcohol use: Yes   Drug use: Not Currently   Sexual activity: Yes  Other Topics Concern   Not on file  Social History Narrative   Lives at home with wife. Works for [employer].    Social Drivers of Corporate investment banker Strain: Not on file  Food Insecurity: No Food Insecurity (01/13/2023)   Hunger Vital Sign    Worried About Running Out of Food in the Last Year: Never true    Ran Out of Food in the Last Year: Never true  Transportation Needs: No Transportation Needs (01/13/2023)   PRAPARE - Administrator, Civil Service (Medical): No    Lack of Transportation (Non-Medical): No  Physical Activity: Not on file  Stress: Not on file  Social Connections: Not on file  Intimate Partner Violence: Not At Risk (01/13/2023)   Humiliation, Afraid, Rape, and Kick questionnaire    Fear of Current or Ex-Partner: No    Emotionally Abused: No    Physically Abused: No    Sexually Abused: No    Vital Signs: Blood pressure 129/76, pulse 89, resp. rate 16, height 5' 11 (1.803 m), weight 177 lb (80.3 kg), SpO2 98%. Body mass index is 24.69 kg/m.    Examination: General Appearance: The patient is well-developed, well-nourished, and in no distress. Neck Circumference: 39 cm Skin: Gross inspection of skin unremarkable. Head: normocephalic, no gross deformities. Eyes: no gross deformities noted. ENT: ears appear grossly normal Neurologic: Alert and oriented. No involuntary movements.  STOP BANG RISK ASSESSMENT S (snore) Have you been told that you snore?     NO   T (tired) Are you often tired, fatigued, or sleepy during the day?   YES  O (obstruction) Do you stop breathing, choke, or gasp during sleep? NO   P (pressure) Do  you have or are you being treated for high blood pressure? NO   B (BMI) Is your body index greater than 35 kg/m? NO   A (age) Are you 71 years old or older? YES   N (neck) Do you have a neck circumference greater than 16 inches?   NO   G (gender) Are you a male? YES   TOTAL STOP/BANG "YES" ANSWERS 3       A STOP-Bang score of 2 or less is considered low risk, and a score of 5 or more is high risk for having either moderate or severe OSA. For people who score 3 or 4, doctors may need to perform further assessment to determine how likely they are to have OSA.  EPWORTH SLEEPINESS SCALE:  Scale:  (0)= no chance of dozing; (1)= slight chance of dozing; (2)= moderate chance of dozing; (3)= high chance of dozing  Chance  Situtation    Sitting and reading: 2    Watching TV: 1    Sitting Inactive in public: 1    As a passenger in car: 2      Lying down to rest: 2    Sitting and talking: 1    Sitting quielty after lunch: 2    In a car, stopped in traffic: 1   TOTAL SCORE:   12 out of 24    SLEEP STUDIES:  PSG - 11/2006 @ [facility] - AHI 24/hr, RDI 36/hr, REM AHI 22.4/hr, Supine AHI 56.3/Hr, min Sp02 77.2% [facility]ration - [facility]AP @ 5cmH20 PSG - 03/18/2023 - AHI 70.2/hr, min Sp02 59%   CPAP COMPLIANCE DATA:  Date Range: 07/03/2023-09/03/2023  Average Daily Use: 6 hours 11 minutes  Median Use: 6 hours 17 minutes  Compliance for > 4 Hours: 89%  AHI: 2.4 respiratory events per hour  Days Used: 60/63 days  Mask Leak: 6.6  95th Percentile Pressure: 15.4         LABS: Recent Results (from the past 2160 hours)  CMP12+LP+TP+TSH+6AC+PSA+CBC.     Status: Abnormal   Collection Time: 06/25/23  9:45 AM  Result Value Ref Range   Glucose 96 70 - 99 mg/dL   Uric Acid 5.4 3.8 - 8.4 mg/dL    Comment:            Therapeutic target for gout patients: <6.0   BUN 18 8 - 27 mg/dL   Creatinine, Ser 8.82 0.76 - 1.27 mg/dL   eGFR 71  >40 fO/fpw/8.26   BUN/Creatinine Ratio 15 10 - 24   Sodium 138 134 - 144 mmol/L   Potassium 4.3 3.5 - 5.2 mmol/L   Chloride 98 96 - 106 mmol/L   Calcium  10.6 (H) 8.6 - 10.2 mg/dL   Phosphorus 3.9 2.8 - 4.1 mg/dL   Total Protein 7.6 6.0 - 8.5 g/dL   Albumin 4.8 3.9 - 4.9 g/dL   Globulin, Total 2.8 1.5 - 4.5 g/dL   Bilirubin Total 0.4 0.0 - 1.2 mg/dL   Alkaline Phosphatase 65 44 - 121 IU/L   LDH 131 121 - 224 IU/L   AST 25 0 - 40 IU/L   ALT 27 0 - 44 IU/L   GGT 25 0 - 65 IU/L   Iron 69 38 - 169 ug/dL   Cholesterol, Total 841 100 - 199 mg/dL   Triglycerides 851 0 - 149 mg/dL   HDL 66 >60 mg/dL   VLDL Cholesterol Cal 25 5 - 40 mg/dL   LDL Chol Calc (NIH) 67 0 - 99 mg/dL   Chol/HDL Ratio 2.4 0.0 - 5.0 ratio    Comment:                                   T. Chol/HDL Ratio                                             Men  Women  1/2 Avg.Risk  3.4    3.3                                   Avg.Risk  5.0    4.4                                2X Avg.Risk  9.6    7.1                                3X Avg.Risk 23.4   11.0    Estimated CHD Risk  < 0.5 0.0 - 1.0 times avg.    Comment: The CHD Risk is based on the T. Chol/HDL ratio. Other factors affect CHD Risk such as hypertension, smoking, diabetes, severe obesity, and family history of premature CHD.    TSH 2.450 0.450 - 4.500 uIU/mL   T4, Total 7.6 4.5 - 12.0 ug/dL   T3 Uptake Ratio 25 24 - 39 %   Free Thyroxine Index 1.9 1.2 - 4.9   Prostate Specific Ag, Serum <0.1 0.0 - 4.0 ng/mL    Comment: Roche ECLIA methodology. According to the American Urological Association, Serum PSA should decrease and remain at undetectable levels after radical prostatectomy. The AUA defines biochemical recurrence as an initial PSA value 0.2 ng/mL or greater followed by a subsequent confirmatory PSA value 0.2 ng/mL or greater. Values obtained with different assay methods or kits cannot be used interchangeably. Results cannot  be interpreted as absolute evidence of the presence or absence of malignant disease.    WBC 6.2 3.4 - 10.8 x10E3/uL   RBC 3.97 (L) 4.14 - 5.80 x10E6/uL   Hemoglobin 12.6 (L) 13.0 - 17.7 g/dL   Hematocrit 62.0 62.4 - 51.0 %   MCV 96 79 - 97 fL   MCH 31.7 26.6 - 33.0 pg   MCHC 33.2 31.5 - 35.7 g/dL   RDW 87.3 88.3 - 84.5 %   Platelets 244 150 - 450 x10E3/uL   Neutrophils 67 Not Estab. %   Lymphs 15 Not Estab. %   Monocytes 13 Not Estab. %   Eos 4 Not Estab. %   Basos 1 Not Estab. %   Neutrophils Absolute 4.2 1.4 - 7.0 x10E3/uL   Lymphocytes Absolute 0.9 0.7 - 3.1 x10E3/uL   Monocytes Absolute 0.8 0.1 - 0.9 x10E3/uL   EOS (ABSOLUTE) 0.2 0.0 - 0.4 x10E3/uL   Basophils Absolute 0.0 0.0 - 0.2 x10E3/uL   Immature Granulocytes 0 Not Estab. %   Immature Grans (Abs) 0.0 0.0 - 0.1 x10E3/uL  Hgb A1c w/o eAG     Status: Abnormal   Collection Time: 06/25/23  9:45 AM  Result Value Ref Range   Hgb A1c MFr Bld 6.1 (H) 4.8 - 5.6 %    Comment:          Prediabetes: 5.7 - 6.4          Diabetes: >6.4          Glycemic control for adults with diabetes: <7.0   Microalbumin / creatinine urine ratio     Status: None   Collection Time: 06/25/23  9:45 AM  Result Value Ref Range   Creatinine, Urine 120.4 Not Estab. mg/dL   Microalbumin, Urine 6.5 Not Estab. ug/mL   Microalb/Creat Ratio 5 0 - 29  mg/g creat    Comment:                        Normal:                0 -  29                        Moderately increased: 30 - 300                        Severely increased:       >300   POCT urinalysis dipstick     Status: None   Collection Time: 06/25/23 10:42 AM  Result Value Ref Range   Color, UA Bright Yellow    Clarity, UA Clear    Glucose, UA Negative Negative   Bilirubin, UA Negative    Ketones, UA Negative    Spec Grav, UA 1.015 1.010 - 1.025   Blood, UA Negative    pH, UA 6.0 5.0 - 8.0   Protein, UA Negative Negative   Urobilinogen, UA 0.2 0.2 or 1.0 E.U./dL   Nitrite, UA Negative     Leukocytes, UA Negative Negative   Appearance     Odor      Radiology: No results found.  No results found.  No results found.    Assessment and Plan: Patient Active Problem List   Diagnosis Date Noted   CPAP use counseling 09/08/2023   Prostate cancer (HCC) 01/13/2023   OSA (obstructive sleep apnea) 12/02/2022   Sleepwalking 12/02/2022   Type 2 diabetes mellitus without complication, without long-term current use of insulin (HCC) 10/20/2018   Elevated BP without diagnosis of hypertension 10/20/2018   Other hyperlipidemia 10/20/2018   Erectile dysfunction 10/20/2018   Family history of colon cancer 10/20/2018   1. OSA (obstructive sleep apnea) (Primary) The patient does tolerate PAP and reports  benefit from PAP use. The patient was reminded how to clean equipment and advised to replace supplies routinely. . The compliance is very good. The AHI is 2.4.   OSA on cpap- controlled. Continue with compliance with pap. CPAP continues to be medically necessary to treat this patient's OSA. F/u 22m  2. CPAP use counseling CPAP Counseling: had a lengthy discussion with the patient regarding the importance of PAP therapy in management of the sleep apnea. Patient appears to understand the risk factor reduction and also understands the risks associated with untreated sleep apnea. Patient will try to make a good faith effort to remain compliant with therapy. Also instructed the patient on proper cleaning of the device including the water  must be changed daily if possible and use of distilled water  is preferred. Patient understands that the machine should be regularly cleaned with appropriate recommended cleaning solutions that do not damage the PAP machine for example given white vinegar and water  rinses. Other methods such as ozone treatment may not be as good as these simple methods to achieve cleaning.   3. Restless leg syndrome He has very limited sleep schedule. He is not sleeping well,  partly due to restless legs. Will try gabapentin . F/u 79m     General Counseling: I have discussed the findings of the evaluation and examination with Twyla.  I have also discussed any further diagnostic evaluation thatmay be needed or ordered today. Moris verbalizes understanding of the findings of todays visit. We also reviewed his medications today and discussed drug interactions and  side effects including but not limited excessive drowsiness and altered mental states. We also discussed that there is always a risk not just to him but also people around him. he has been encouraged to call the office with any questions or concerns that should arise related to todays visit.  No orders of the defined types were placed in this encounter.       I have personally obtained a history, examined the patient, evaluated laboratory and imaging results, formulated the assessment and plan and placed orders. This patient was seen today by Lauraine Lay, PA-C in collaboration with Dr. Elfreda Bathe.   Elfreda DELENA Bathe, MD [facility] Diplomate ABMS Pulmonary Critical Care Medicine and Sleep Medicine

## 2023-09-08 ENCOUNTER — Ambulatory Visit (INDEPENDENT_AMBULATORY_CARE_PROVIDER_SITE_OTHER): Admitting: Internal Medicine

## 2023-09-08 VITALS — BP 129/76 | HR 89 | Resp 16 | Ht 71.0 in | Wt 177.0 lb

## 2023-09-08 DIAGNOSIS — G4733 Obstructive sleep apnea (adult) (pediatric): Secondary | ICD-10-CM

## 2023-09-08 DIAGNOSIS — Z7189 Other specified counseling: Secondary | ICD-10-CM | POA: Diagnosis not present

## 2023-09-08 DIAGNOSIS — G2581 Restless legs syndrome: Secondary | ICD-10-CM | POA: Diagnosis not present

## 2023-09-08 MED ORDER — GABAPENTIN 100 MG PO CAPS: ORAL_CAPSULE | ORAL | 2 refills | Status: AC

## 2023-09-08 NOTE — Patient Instructions (Signed)

## 2023-09-09 ENCOUNTER — Other Ambulatory Visit: Payer: Self-pay

## 2023-09-09 ENCOUNTER — Ambulatory Visit (INDEPENDENT_AMBULATORY_CARE_PROVIDER_SITE_OTHER): Payer: Self-pay | Admitting: Physician Assistant

## 2023-09-09 ENCOUNTER — Encounter: Payer: Self-pay | Admitting: Physician Assistant

## 2023-09-09 VITALS — BP 114/74 | HR 68 | Ht 71.0 in | Wt 174.6 lb

## 2023-09-09 DIAGNOSIS — C61 Malignant neoplasm of prostate: Secondary | ICD-10-CM

## 2023-09-09 DIAGNOSIS — N5231 Erectile dysfunction following radical prostatectomy: Secondary | ICD-10-CM

## 2023-09-09 MED ORDER — SILDENAFIL CITRATE 100 MG PO TABS: 100.0000 mg | ORAL_TABLET | Freq: Every day | ORAL | 5 refills | Status: DC

## 2023-09-09 NOTE — Patient Instructions (Signed)
Physical therapy scheduling: [phone]

## 2023-09-09 NOTE — Progress Notes (Signed)
 09/09/2023 2:04 PM   Jack Marshall Jul 08, 1960 [id number]  CC: Chief Complaint  Patient presents with   Follow-up   HPI: Jack Marshall is a 63 y.o. male with PMH prostate cancer s/p nerve-sparing RALP with Dr. Francisca on 01/13/2023 with detectable postop PSA who underwent salvage radiation and ADT x 6 months earlier this year who presents today for follow-up.  He is accompanied today by his wife..   Today he reports fatigue and hot flashes on ADT.  He remains on calcium  and vitamin D supplements.  He describes mixed incontinence with stress and urge features.  He is not having any spontaneous erections.  He has been referred for pelvic floor physical therapy, though he has not been contacted to schedule this yet.  PMH: Past Medical History:  Diagnosis Date   Colon polyp    2009, most recent in 2020 no polyps   Decreased vision of right eye    due to an accident    Diabetes mellitus without complication (HCC)    type 2   Family history of colon cancer    Hyperlipidemia    Lack of exercise    Overweight    Prostate cancer (HCC)    Seasonal allergies    Sleep apnea    does not wear cpap   Testicular mass    right    Ventral hernia     Surgical History: Past Surgical History:  Procedure Laterality Date   COLONOSCOPY     normal colon   COLONOSCOPY W/ POLYPECTOMY  12/09/2005   9 mm TA in ascending colon   COLONOSCOPY WITH PROPOFOL  N/A 09/23/2018   Procedure: COLONOSCOPY WITH PROPOFOL ;  Surgeon: Toledo, Ladell POUR, MD;  Location: [facility] END[facility];  Service: Gastroenterology;  Laterality: N/A;   EYE SURGERY     PELVIC LYMPH NODE DISSECTION Bilateral 01/13/2023   Procedure: PELVIC LYMPH NODE DISSECTION;  Surgeon: Francisca Redell BROCKS, MD;  Location: [facility] ORS;  Service: Urology;  Laterality: Bilateral;   ROBOT ASSISTED LAPAROSCOPIC RADICAL PROSTATECTOMY N/A 01/13/2023   Procedure: XI ROBOTIC ASSISTED LAPAROSCOPIC RADICAL PROSTATECTOMY;  Surgeon: Francisca Redell BROCKS, MD;   Location: [facility] ORS;  Service: Urology;  Laterality: N/A;   TESTICULAR EXPLORATION      Home Medications:  Allergies as of 09/09/2023   No Known Allergies      Medication List        Accurate as of September 09, 2023  2:04 PM. If you have any questions, ask your nurse or doctor.          atorvastatin  10 MG tablet Commonly known as: LIPITOR Take 1 tablet (10 mg total) by mouth daily.   b complex vitamins capsule Take 1 capsule by mouth daily.   FreeStyle Libre 3 Plus Sensor Misc CHANGE SENSOR EVERY 15 DAYS   gabapentin  100 MG capsule Commonly known as: NEURONTIN  Take one capsule 90 minutes prior to bedtime. After two weeks increase to two capsules.   leuprolide  (6 Month) 45 MG injection Commonly known as: ELIGARD  Inject 45 mg into the skin every 6 (six) months.   metFORMIN  1000 MG tablet Commonly known as: GLUCOPHAGE  Take 1 tablet (1,000 mg total) by mouth 2 (two) times daily with a meal.   multivitamin capsule Take 1 capsule by mouth daily.   sildenafil  100 MG tablet Commonly known as: VIAGRA  Take 1 tablet (100 mg total) by mouth daily. Started by: Sterling Ucci   VITAMIN C PO Take 1 tablet by mouth daily.  Allergies:  No Known Allergies  Family History: Family History  Problem Relation Age of Onset   Diabetes Mother    Colon cancer Father    Prostate cancer Father    Colon cancer Brother     Social History:   reports that he has never smoked. He has never been exposed to tobacco smoke. He has never used smokeless tobacco. He reports current alcohol use. He reports that he does not currently use drugs.  Physical Exam: BP 114/74   Pulse 68   Ht 5' 11 (1.803 m)   Wt 174 lb 9.6 oz (79.2 kg)   BMI 24.35 kg/m   Constitutional:  Alert and oriented, no acute distress, nontoxic appearing HEENT: [place], AT Cardiovascular: No clubbing, cyanosis, or edema Respiratory: Normal respiratory effort, no increased work of breathing Skin: No rashes,  bruises or suspicious lesions Neurologic: Grossly intact, no focal deficits, moving all 4 extremities Psychiatric: Normal mood and affect  Assessment & Plan:   1. Prostate cancer (HCC) (Primary) Due for PSA, drawn today.  I will contact him with results.  I do not anticipate further ADT at this time, though we may revisit based on PSA results.  We discussed that I anticipate his serum testosterone will slowly return to baseline in the coming months.  He may discontinue calcium  and vitamin D supplements within the next 3 to 6 months. - PSA - PSA; Future  2. Erectile dysfunction after radical prostatectomy Will start sildenafil  100 mg daily for pelvic rehab.  I agree with pelvic floor physical therapy, and I gave him the phone number for scheduling today. - sildenafil  (VIAGRA ) 100 MG tablet; Take 1 tablet (100 mg total) by mouth daily.  Dispense: 30 tablet; Refill: 5   Return in about 6 months (around 03/11/2024) for Prostate cancer f/u with Dr. Francisca.  Lucie Hones, PA-C  [facility] [place] [address] [place], [place] [address] [phone]

## 2023-09-10 ENCOUNTER — Ambulatory Visit: Payer: Self-pay | Admitting: Physician Assistant

## 2023-09-10 DIAGNOSIS — H5213 Myopia, bilateral: Secondary | ICD-10-CM | POA: Diagnosis not present

## 2023-09-10 LAB — PSA: Prostate Specific Ag, Serum: <0.1 ng/mL (ref 0.0–4.0)

## 2023-09-16 ENCOUNTER — Inpatient Hospital Stay: Attending: Radiation Oncology

## 2023-09-16 DIAGNOSIS — C61 Malignant neoplasm of prostate: Secondary | ICD-10-CM | POA: Diagnosis not present

## 2023-09-16 LAB — CBC (CANCER CENTER ONLY)
HCT: 32.6 % — ABNORMAL LOW (ref 39.0–52.0)
Hemoglobin: 10.7 g/dL — ABNORMAL LOW (ref 13.0–17.0)
MCH: 30.8 pg (ref 26.0–34.0)
MCHC: 32.8 g/dL (ref 30.0–36.0)
MCV: 93.9 fL (ref 80.0–100.0)
Platelet Count: 184 K/uL (ref 150–400)
RBC: 3.47 MIL/uL — ABNORMAL LOW (ref 4.22–5.81)
RDW: 12 % (ref 11.5–15.5)
WBC Count: 5.5 K/uL (ref 4.0–10.5)
nRBC: 0 % (ref 0.0–0.2)

## 2023-09-16 LAB — PSA: Prostatic Specific Antigen: <0.01 ng/mL (ref 0.00–4.00)

## 2023-09-23 ENCOUNTER — Ambulatory Visit
Admission: RE | Admit: 2023-09-23 | Discharge: 2023-09-23 | Disposition: A | Discharge status: Home / Self Care | Source: Ambulatory Visit | Attending: Radiation Oncology | Admitting: Radiation Oncology

## 2023-09-23 ENCOUNTER — Other Ambulatory Visit: Payer: Self-pay | Admitting: *Deleted

## 2023-09-23 ENCOUNTER — Encounter: Payer: Self-pay | Admitting: Radiation Oncology

## 2023-09-23 VITALS — BP 124/76 | HR 90 | Temp 98.6°F | Resp 12 | Wt 174.0 lb

## 2023-09-23 DIAGNOSIS — Z191 Hormone sensitive malignancy status: Secondary | ICD-10-CM | POA: Diagnosis not present

## 2023-09-23 DIAGNOSIS — C61 Malignant neoplasm of prostate: Secondary | ICD-10-CM

## 2023-09-23 NOTE — Progress Notes (Signed)
 Radiation Oncology Follow up Note  Name: Jack Marshall   Date:   09/23/2023 MRN:  [id number] DOB: 30-May-1960    This 63 y.o. male presents to the clinic today for 43-month follow-up status post salvage radiation therapy for Gleason 9 (4+5) adenocarcinoma the prostate presenting with a PSA in the 5 range status post robotic assisted prostatectomy for stage IIIc (pT2 N0 M0) with biochemical failure.  REFERRING PROVIDER: Claudene Tanda POUR, PA-C  HPI: Patient is a 63 year old male status post robotic assisted prostatectomy for Gleason 9 (4+5) adenocarcinoma who presented postoperatively with a rising PSA of 0.3.  Margins were negative at the time of surgery no extraprostatic extension and all sampled lymph nodes were negative.  He is seen today for months out from salvage radiation therapy which he tolerated well.  Still has some dribbling specifically [place] any GI complaints.  He is having no bone pain.  His most recent PSA was less than 0.01 showing excellent biochemical response to treatment..  COMPLICATIONS OF TREATMENT: none  FOLLOW UP COMPLIANCE: keeps appointments   PHYSICAL EXAM:  BP 124/76   Pulse 90   Temp 98.6 F (37 C) (Tympanic)   Resp 12   Wt 174 lb (78.9 kg)   BMI 24.27 kg/m  Well-developed well-nourished patient in NAD. HEENT reveals PERLA, EOMI, discs not visualized.  Oral cavity is clear. No oral mucosal lesions are identified. Neck is clear without evidence of cervical or supraclavicular adenopathy. Lungs are clear to A&P. Cardiac examination is essentially unremarkable with regular rate and rhythm without murmur rub or thrill. Abdomen is benign with no organomegaly or masses noted. Motor sensory and DTR levels are equal and symmetric in the upper and lower extremities. Cranial nerves II through XII are grossly intact. Proprioception is intact. No peripheral adenopathy or edema is identified. No motor or sensory levels are noted. Crude visual fields are within normal  range.  RADIOLOGY RESULTS: No current films for review  PLAN: At the present time patient is doing well very low side effect profile.  He has had no increased lower urinary tract symptoms.  He is in complete remission at this time based on his PSA.  Of asked to see him back in 6 months for follow-up with repeat PSA at that time.  Patient knows to call with any concerns.  I would like to take this opportunity to thank you for allowing me to participate in the care of your patient.SABRA Marcey Penton, MD

## 2023-10-03 DIAGNOSIS — G4733 Obstructive sleep apnea (adult) (pediatric): Secondary | ICD-10-CM | POA: Diagnosis not present

## 2023-10-08 DIAGNOSIS — G4733 Obstructive sleep apnea (adult) (pediatric): Secondary | ICD-10-CM | POA: Diagnosis not present

## 2023-10-17 ENCOUNTER — Other Ambulatory Visit: Payer: Self-pay

## 2023-10-17 ENCOUNTER — Other Ambulatory Visit: Admission: RE | Admit: 2023-10-17 | Discharge: 2023-10-17 | Disposition: A | Discharge status: Home / Self Care

## 2023-10-17 DIAGNOSIS — H47239 Glaucomatous optic atrophy, unspecified eye: Secondary | ICD-10-CM | POA: Diagnosis not present

## 2023-10-17 DIAGNOSIS — H469 Unspecified optic neuritis: Secondary | ICD-10-CM | POA: Insufficient documentation

## 2023-10-17 DIAGNOSIS — E119 Type 2 diabetes mellitus without complications: Secondary | ICD-10-CM | POA: Diagnosis not present

## 2023-10-17 DIAGNOSIS — H4010X0 Unspecified open-angle glaucoma, stage unspecified: Secondary | ICD-10-CM | POA: Diagnosis not present

## 2023-10-17 DIAGNOSIS — H2513 Age-related nuclear cataract, bilateral: Secondary | ICD-10-CM | POA: Diagnosis not present

## 2023-10-17 LAB — VITAMIN B12: Vitamin B-12: 529 pg/mL (ref 180–914)

## 2023-10-17 LAB — FOLATE: Folate: >20 ng/mL (ref >5.9)

## 2023-10-18 ENCOUNTER — Ambulatory Visit
Admission: RE | Admit: 2023-10-18 | Discharge: 2023-10-18 | Disposition: A | Discharge status: Home / Self Care | Source: Ambulatory Visit

## 2023-10-18 DIAGNOSIS — H469 Unspecified optic neuritis: Secondary | ICD-10-CM | POA: Insufficient documentation

## 2023-10-18 DIAGNOSIS — C61 Malignant neoplasm of prostate: Secondary | ICD-10-CM | POA: Diagnosis not present

## 2023-10-18 MED ORDER — GADOBUTROL 1 MMOL/ML IV SOLN
7.5000 mL | Freq: Once | INTRAVENOUS | Status: AC | PRN
  Administered 2023-10-18: 7.5 mL via INTRAVENOUS

## 2023-11-03 DIAGNOSIS — G4733 Obstructive sleep apnea (adult) (pediatric): Secondary | ICD-10-CM | POA: Diagnosis not present

## 2023-11-07 NOTE — Progress Notes (Signed)
 [facility] [address] [place], [place] [address]  Pulmonary Sleep Medicine   Office Visit Note  Patient Name: Jack Marshall DOB: [birth year] MRN [id number]    Chief Complaint: Obstructive Sleep Apnea visit  Brief History:  Gerson is seen today for a follow up visit for APAP@ 4-20 cmH2O. The patient has a 16 year history of sleep apnea. Patient is using PAP nightly.  The patient feels rested after sleeping with PAP.  The patient reports benefiting from PAP use. Reported sleepiness is somewhat improved and the Epworth Sleepiness Score is 18 out of 24. The patient does not take naps. The patient complains of the following: occasional daytime sleepiness.  The compliance download shows  75% compliance with an average use time of 5 hours 41 minutes. The AHI is 2.3.  The patient does complain of limb movements disrupting sleep. The patient continues to require PAP therapy in order to eliminate sleep apnea.   ROS  General: (-) fever, (-) chills, (-) night sweat Nose and Sinuses: (-) nasal stuffiness or itchiness, (-) postnasal drip, (-) nosebleeds, (-) sinus trouble. Mouth and Throat: (-) sore throat, (-) hoarseness. Neck: (-) swollen glands, (-) enlarged thyroid, (-) neck pain. Respiratory: - cough, - shortness of breath, - wheezing. Neurologic: - numbness, - tingling. Psychiatric: - anxiety, - depression   Current Medication: Outpatient Encounter Medications as of 11/10/2023  Medication Sig   Ascorbic Acid (VITAMIN C PO) Take 1 tablet by mouth daily.   atorvastatin  (LIPITOR) 10 MG tablet Take 1 tablet (10 mg total) by mouth daily.   b complex vitamins capsule Take 1 capsule by mouth daily.   Continuous Glucose Sensor (FREESTYLE LIBRE 3 PLUS SENSOR) MISC CHANGE SENSOR EVERY 15 DAYS   gabapentin  (NEURONTIN ) 100 MG capsule Take one capsule 90 minutes prior to bedtime. After two weeks increase to two capsules.   leuprolide , 6 Month, (ELIGARD ) 45 MG injection Inject  45 mg into the skin every 6 (six) months.   metFORMIN  (GLUCOPHAGE ) 1000 MG tablet Take 1 tablet (1,000 mg total) by mouth 2 (two) times daily with a meal.   Multiple Vitamin (MULTIVITAMIN) capsule Take 1 capsule by mouth daily.   sildenafil  (VIAGRA ) 100 MG tablet Take 1 tablet (100 mg total) by mouth daily.   No facility-administered encounter medications on file as of 11/10/2023.    Surgical History: Past Surgical History:  Procedure Laterality Date   COLONOSCOPY     normal colon   COLONOSCOPY W/ POLYPECTOMY  12/09/2005   9 mm TA in ascending colon   COLONOSCOPY WITH PROPOFOL  N/A 09/23/2018   Procedure: COLONOSCOPY WITH PROPOFOL ;  Surgeon: Toledo, Ladell POUR, MD;  Location: [facility] ENDOSCOPY;  Service: Gastroenterology;  Laterality: N/A;   EYE SURGERY     PELVIC LYMPH NODE DISSECTION Bilateral 01/13/2023   Procedure: PELVIC LYMPH NODE DISSECTION;  Surgeon: Francisca Redell BROCKS, MD;  Location: [facility] ORS;  Service: Urology;  Laterality: Bilateral;   ROBOT ASSISTED LAPAROSCOPIC RADICAL PROSTATECTOMY N/A 01/13/2023   Procedure: XI ROBOTIC ASSISTED LAPAROSCOPIC RADICAL PROSTATECTOMY;  Surgeon: Francisca Redell BROCKS, MD;  Location: [facility] ORS;  Service: Urology;  Laterality: N/A;   TESTICULAR EXPLORATION      Medical History: Past Medical History:  Diagnosis Date   Colon polyp    2009, most recent in 2020 no polyps   Decreased vision of right eye    due to an accident    Diabetes mellitus without complication (HCC)    type 2   Family history of colon  cancer    Hyperlipidemia    Lack of exercise    Overweight    Prostate cancer (HCC)    Seasonal allergies    Sleep apnea    does not wear cpap   Testicular mass    right    Ventral hernia     Family History: Non contributory to the present illness  Social History: Social History   Socioeconomic History   Marital status: Married    Spouse name: Not on file   Number of children: Not on file   Years of education: Not on file   Highest  education level: Not on file  Occupational History   Not on file  Tobacco Use   Smoking status: Never    Passive exposure: Never   Smokeless tobacco: Never  Vaping Use   Vaping status: Never Used  Substance and Sexual Activity   Alcohol use: Yes   Drug use: Not Currently   Sexual activity: Yes  Other Topics Concern   Not on file  Social History Narrative   Lives at home with wife. Works for [employer].    Social Drivers of Corporate investment banker Strain: Not on file  Food Insecurity: No Food Insecurity (01/13/2023)   Hunger Vital Sign    Worried About Running Out of Food in the Last Year: Never true    Ran Out of Food in the Last Year: Never true  Transportation Needs: No Transportation Needs (01/13/2023)   PRAPARE - Administrator, Civil Service (Medical): No    Lack of Transportation (Non-Medical): No  Physical Activity: Not on file  Stress: Not on file  Social Connections: Not on file  Intimate Partner Violence: Not At Risk (01/13/2023)   Humiliation, Afraid, Rape, and Kick questionnaire    Fear of Current or Ex-Partner: No    Emotionally Abused: No    Physically Abused: No    Sexually Abused: No    Vital Signs: There were no vitals taken for this visit. There is no height or weight on file to calculate BMI.    Examination: General Appearance: The patient is well-developed, well-nourished, and in no distress. Neck Circumference: 39 cm Skin: Gross inspection of skin unremarkable. Head: normocephalic, no gross deformities. Eyes: no gross deformities noted. ENT: ears appear grossly normal Neurologic: Alert and oriented. No involuntary movements.  STOP BANG RISK ASSESSMENT S (snore) Have you been told that you snore?     NO   T (tired) Are you often tired, fatigued, or sleepy during the day?   YES  O (obstruction) Do you stop breathing, choke, or gasp during sleep? NO   P (pressure) Do you have or are you being treated for high  blood pressure? NO   B (BMI) Is your body index greater than 35 kg/m? NO   A (age) Are you 30 years old or older? YES   N (neck) Do you have a neck circumference greater than 16 inches?   NO   G (gender) Are you a male? YES   TOTAL STOP/BANG "YES" ANSWERS 3       A STOP-Bang score of 2 or less is considered low risk, and a score of 5 or more is high risk for having either moderate or severe OSA. For people who score 3 or 4, doctors may need to perform further assessment to determine how likely they are to have OSA.         EPWORTH SLEEPINESS SCALE:  Scale:  (0)= no chance of dozing; (1)= slight chance of dozing; (2)= moderate chance of dozing; (3)= high chance of dozing  Chance  Situtation    Sitting and reading: 0    Watching TV: 3    Sitting Inactive in public: 3    As a passenger in car: 3      Lying down to rest: 3    Sitting and talking: 2    Sitting quielty after lunch: 3    In a car, stopped in traffic: 1   TOTAL SCORE:   18 out of 24    SLEEP STUDIES:  PSG - 11/2006 @ [facility] - AHI 24/hr, RDI 36/hr, REM AHI 22.4/hr, Supine AHI 56.3/Hr, min Sp02 77.2% Titration - 12/2006 @ [facility] - CPAP @ 5cmH20 PSG - 03/18/2023 - AHI 70.2/hr, min Sp02 59%   CPAP COMPLIANCE DATA:  Date Range: 09/03/2023-11/04/2023  Average Daily Use: 5 hours 41 minutes  Median Use: 5 hours 38 minutes  Compliance for > 4 Hours: 75%  AHI: 2.3 respiratory events per hour  Days Used: 54/63 days  Mask Leak: 8.2  95th Percentile Pressure: 15.8         LABS: Recent Results (from the past 2160 hours)  PSA     Status: None   Collection Time: 09/09/23  1:18 PM  Result Value Ref Range   Prostate Specific Ag, Serum <0.1 0.0 - 4.0 ng/mL    Comment: Roche ECLIA methodology. According to the American Urological Association, Serum PSA should decrease and remain at undetectable levels after radical prostatectomy. The AUA defines biochemical recurrence as an  initial PSA value 0.2 ng/mL or greater followed by a subsequent confirmatory PSA value 0.2 ng/mL or greater. Values obtained with different assay methods or kits cannot be used interchangeably. Results cannot be interpreted as absolute evidence of the presence or absence of malignant disease.   PSA     Status: None   Collection Time: 09/16/23  9:33 AM  Result Value Ref Range   Prostatic Specific Antigen <0.01 0.00 - 4.00 ng/mL    Comment: (NOTE) While PSA levels of <=4.00 ng/ml are reported as reference range, some men with levels below 4.00 ng/ml can have prostate cancer and many men with PSA above 4.00 ng/ml do not have prostate cancer.  Other tests such as free PSA, age specific reference ranges, PSA velocity and PSA doubling time may be helpful especially in men less than 40 years old. Performed at [facility], [address] [place], [place] [address]   CBC (Cancer Center Only)     Status: Abnormal   Collection Time: 09/16/23  9:33 AM  Result Value Ref Range   WBC Count 5.5 4.0 - 10.5 K/uL   RBC 3.47 (L) 4.22 - 5.81 MIL/uL   Hemoglobin 10.7 (L) 13.0 - 17.0 g/dL   HCT 67.3 (L) 60.9 - 47.9 %   MCV 93.9 80.0 - 100.0 fL   MCH 30.8 26.0 - 34.0 pg   MCHC 32.8 30.0 - 36.0 g/dL   RDW 87.9 88.4 - 84.4 %   Platelet Count 184 150 - 400 K/uL   nRBC 0.0 0.0 - 0.2 %    Comment: Performed at [facility], [address], [place], [place] [address]  Folate     Status: None   Collection Time: 10/17/23 12:39 PM  Result Value Ref Range   Folate >20.0 >5.9 ng/mL    Comment: Performed at [facility], [address] [place], [place] [address]  Vitamin B12     Status: None   Collection Time: 10/17/23 12:39 PM  Result Value Ref Range   Vitamin B-12 529 180 - 914 pg/mL    Comment: (NOTE) This assay is not validated for testing neonatal or myeloproliferative syndrome specimens for Vitamin B12 levels. Performed at [facility], [address]  [place], [place] [address]     Radiology: MR VAUGHAN ORN WO CONTRAST Result Date: 10/28/2023 CLINICAL DATA:  63 year old male with abnormal appearance of optic nerves on eye exam. Bilateral optic neuropathy. History of treated prostate cancer. EXAM: MRI HEAD WITHOUT AND WITH CONTRAST TECHNIQUE: Multiplanar, multiecho pulse sequences of the brain and surrounding structures were obtained without and with intravenous contrast. CONTRAST:  7.5mL GADAVIST  GADOBUTROL  1 MMOL/ML IV SOLN COMPARISON:  PET-CT 03/10/2023. FINDINGS: Brain: No restricted diffusion to suggest acute infarction. No midline shift, mass effect, evidence of mass lesion, ventriculomegaly, extra-axial collection or acute intracranial hemorrhage. Cervicomedullary junction and pituitary are within normal limits. Elnor and white matter signal is within normal limits for age throughout the brain. No encephalomalacia or chronic cerebral blood products identified. No abnormal enhancement identified. Little to no intravenous contrast is apparent on postcontrast series 17. But there is adequate contrast present on both series 18 and series 19 postcontrast T1 sequences. Head no dural thickening identified. Vascular: Major intracranial vascular flow voids are preserved. On postcontrast images the major dural venous sinuses are enhancing and appear to be patent. Skull and upper cervical spine: Visualized bone marrow signal is within normal limits. No destructive or suspicious osseous lesion identified in the head. Sinuses/Orbits: Routine MRI appearance of the orbits positive for a degree of disconjugate gaze (series 8, image 9). But no evidence of intraorbital mass or inflammation. No thin slice evaluation of the optic nerves (dedicated orbit MRI without and with contrast would be necessary) which appear grossly symmetric. Normal suprasellar cistern. Optic chiasm appears to be normal. Cavernous sinus appears normal. Paranasal Visualized paranasal sinuses and  mastoids are stable and well aerated. Other: Visible internal auditory structures appear normal. Negative visible scalp and face. IMPRESSION: 1. Normal for age MRI appearance of the Brain. No metastatic disease or acute intracranial abnormality. 2. Routine MRI appearance of the orbits unremarkable aside from nonspecific disconjugate gaze. Electronically Signed   By: VEAR Hurst M.D.   On: 10/28/2023 14:23    No results found.  MR BRAIN/IAC W WO CONTRAST Result Date: 10/28/2023 CLINICAL DATA:  63 year old male with abnormal appearance of optic nerves on eye exam. Bilateral optic neuropathy. History of treated prostate cancer. EXAM: MRI HEAD WITHOUT AND WITH CONTRAST TECHNIQUE: Multiplanar, multiecho pulse sequences of the brain and surrounding structures were obtained without and with intravenous contrast. CONTRAST:  7.5mL GADAVIST  GADOBUTROL  1 MMOL/ML IV SOLN COMPARISON:  PET-CT 03/10/2023. FINDINGS: Brain: No restricted diffusion to suggest acute infarction. No midline shift, mass effect, evidence of mass lesion, ventriculomegaly, extra-axial collection or acute intracranial hemorrhage. Cervicomedullary junction and pituitary are within normal limits. Elnor and white matter signal is within normal limits for age throughout the brain. No encephalomalacia or chronic cerebral blood products identified. No abnormal enhancement identified. Little to no intravenous contrast is apparent on postcontrast series 17. But there is adequate contrast present on both series 18 and series 19 postcontrast T1 sequences. Head no dural thickening identified. Vascular: Major intracranial vascular flow voids are preserved. On postcontrast images the major dural venous sinuses are enhancing and appear to be patent. Skull and  upper cervical spine: Visualized bone marrow signal is within normal limits. No destructive or suspicious osseous lesion identified in the head. Sinuses/Orbits: Routine MRI appearance of the orbits positive for a  degree of disconjugate gaze (series 8, image 9). But no evidence of intraorbital mass or inflammation. No thin slice evaluation of the optic nerves (dedicated orbit MRI without and with contrast would be necessary) which appear grossly symmetric. Normal suprasellar cistern. Optic chiasm appears to be normal. Cavernous sinus appears normal. Paranasal Visualized paranasal sinuses and mastoids are stable and well aerated. Other: Visible internal auditory structures appear normal. Negative visible scalp and face. IMPRESSION: 1. Normal for age MRI appearance of the Brain. No metastatic disease or acute intracranial abnormality. 2. Routine MRI appearance of the orbits unremarkable aside from nonspecific disconjugate gaze. Electronically Signed   By: VEAR Hurst M.D.   On: 10/28/2023 14:23      Assessment and Plan: Patient Active Problem List   Diagnosis Date Noted   CPAP use counseling 09/08/2023   Restless leg syndrome 09/08/2023   Prostate cancer (HCC) 01/13/2023   OSA (obstructive sleep apnea) 12/02/2022   Sleepwalking 12/02/2022   Type 2 diabetes mellitus without complication, without long-term current use of insulin (HCC) 10/20/2018   Elevated BP without diagnosis of hypertension 10/20/2018   Other hyperlipidemia 10/20/2018   Erectile dysfunction 10/20/2018   Family history of colon cancer 10/20/2018   1. OSA (obstructive sleep apnea) (Primary) The patient does tolerate PAP and reports  benefit from PAP use. The patient was reminded how to clean equipment and advised to replace supplies routinely. The patient was also counselled on trying to get more sleep. . The compliance is fair. The AHI is 2.3.   OSA on cpap- controlled. Continue with compliance with pap. CPAP continues to be medically necessary to treat this patient's OSA. F/u one year.    2. CPAP use counseling CPAP Counseling: had a lengthy discussion with the patient regarding the importance of PAP therapy in management of the sleep  apnea. Patient appears to understand the risk factor reduction and also understands the risks associated with untreated sleep apnea. Patient will try to make a good faith effort to remain compliant with therapy. Also instructed the patient on proper cleaning of the device including the water  must be changed daily if possible and use of distilled water  is preferred. Patient understands that the machine should be regularly cleaned with appropriate recommended cleaning solutions that do not damage the PAP machine for example given white vinegar and water  rinses. Other methods such as ozone treatment may not be as good as these simple methods to achieve cleaning.   3. Restless leg syndrome Patient has not tried the gabapentin  yet. He plans to try magnesium glycinate first.  Advised to try to limit his physical activity late in the afternoon/evening.     General Counseling: I have discussed the findings of the evaluation and examination with Twyla.  I have also discussed any further diagnostic evaluation thatmay be needed or ordered today. Dorsey verbalizes understanding of the findings of todays visit. We also reviewed his medications today and discussed drug interactions and side effects including but not limited excessive drowsiness and altered mental states. We also discussed that there is always a risk not just to him but also people around him. he has been encouraged to call the office with any questions or concerns that should arise related to todays visit.  No orders of the defined types were placed in this encounter.  I have personally obtained a history, examined the patient, evaluated laboratory and imaging results, formulated the assessment and plan and placed orders. This patient was seen today by Lauraine Lay, PA-C in collaboration with Dr. Elfreda Bathe.   Elfreda DELENA Bathe, MD [facility] Diplomate ABMS Pulmonary Critical Care Medicine and Sleep Medicine

## 2023-11-10 ENCOUNTER — Ambulatory Visit (INDEPENDENT_AMBULATORY_CARE_PROVIDER_SITE_OTHER): Admitting: Internal Medicine

## 2023-11-10 VITALS — BP 134/88 | HR 80 | Resp 16 | Ht 71.0 in | Wt 181.0 lb

## 2023-11-10 DIAGNOSIS — Z7189 Other specified counseling: Secondary | ICD-10-CM

## 2023-11-10 DIAGNOSIS — G2581 Restless legs syndrome: Secondary | ICD-10-CM | POA: Diagnosis not present

## 2023-11-10 DIAGNOSIS — G4733 Obstructive sleep apnea (adult) (pediatric): Secondary | ICD-10-CM

## 2023-11-10 NOTE — Patient Instructions (Signed)

## 2023-11-17 DIAGNOSIS — H4010X0 Unspecified open-angle glaucoma, stage unspecified: Secondary | ICD-10-CM | POA: Diagnosis not present

## 2024-02-02 DIAGNOSIS — G4733 Obstructive sleep apnea (adult) (pediatric): Secondary | ICD-10-CM | POA: Diagnosis not present

## 2024-02-12 ENCOUNTER — Other Ambulatory Visit: Payer: Self-pay | Admitting: Physician Assistant

## 2024-02-12 DIAGNOSIS — E119 Type 2 diabetes mellitus without complications: Secondary | ICD-10-CM

## 2024-03-08 ENCOUNTER — Other Ambulatory Visit

## 2024-03-11 ENCOUNTER — Other Ambulatory Visit

## 2024-03-11 DIAGNOSIS — C61 Malignant neoplasm of prostate: Secondary | ICD-10-CM

## 2024-03-12 LAB — PSA: Prostate Specific Ag, Serum: <0.1 ng/mL (ref 0.0–4.0)

## 2024-03-17 ENCOUNTER — Ambulatory Visit (INDEPENDENT_AMBULATORY_CARE_PROVIDER_SITE_OTHER): Admitting: Urology

## 2024-03-17 VITALS — BP 123/76 | HR 99 | Ht 70.47 in | Wt 172.0 lb

## 2024-03-17 DIAGNOSIS — N529 Male erectile dysfunction, unspecified: Secondary | ICD-10-CM | POA: Diagnosis not present

## 2024-03-17 DIAGNOSIS — N393 Stress incontinence (female) (male): Secondary | ICD-10-CM

## 2024-03-17 DIAGNOSIS — C61 Malignant neoplasm of prostate: Secondary | ICD-10-CM | POA: Diagnosis not present

## 2024-03-17 MED ORDER — TADALAFIL 5 MG PO TABS: 5.0000 mg | ORAL_TABLET | Freq: Every day | ORAL | 11 refills | Status: AC

## 2024-03-17 NOTE — Progress Notes (Signed)
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  Staging imaging negative. 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  Has not been able to follow-up with pelvic floor PT, has not been consistent with Kegel exercises Mild improvement in ED with Viagra  as needed but not sufficient erections Still having intermittent hot flashes-> recommended vitamin E supplementation  Plan:   Prostate cancer: High risk, negative margins but initial postop PSA 0.3 and 0.4, treated with adjuvant radiation and 6 months ADT.  Continue close PSA monitoring every 3 to 4 months.  Intraductal carcinoma also present Incontinence: Remains fairly significant with 4 depends daily, discussed Kegel exercise, referral placed to pelvic floor physical therapy, recommended trialing a Cunningham or Weisner clamp, consider referral to Dr. Lovie in [place] if no improvement at next visit ED: Trial of Cialis   5 mg daily with 10 to 15 mg boost dose as needed RTC 3 to 4 months PSA prior, PVR   Redell JAYSON Burnet, MD  [facility] [address], [address] [place], [place] [address] [phone]  "

## 2024-03-17 NOTE — Patient Instructions (Signed)
 Recommend trying vitamin E supplements for your persistent hot flashes  Try Cialis  5 mg daily with additional 10 to 15 mg as needed 1 to 2 hours prior to sexual activity to replace Viagra  for erections  Kegel exercises can help with urinary leakage.  Feel like you are squeezing the muscle to stop urination and holding it for 5 to 10 seconds, performing this 8-10 times, for 2-3 sets, every other day.  This can be done while watching TV, driving, but not during urination  Consider a Cunningham clamp or Weisner clamp to help with leakage, this can be purchased online or at a local medical supply store

## 2024-03-18 ENCOUNTER — Inpatient Hospital Stay

## 2024-03-18 DIAGNOSIS — C61 Malignant neoplasm of prostate: Secondary | ICD-10-CM

## 2024-03-18 LAB — PSA: Prostatic Specific Antigen: <0.02 ng/mL (ref 0.00–4.00)

## 2024-03-25 ENCOUNTER — Ambulatory Visit: Admitting: Radiation Oncology

## 2024-07-08 ENCOUNTER — Other Ambulatory Visit

## 2024-07-15 ENCOUNTER — Ambulatory Visit: Admitting: Urology
